# Patient Record
Sex: Female | Born: 1990 | Race: Black or African American | Hispanic: No | Marital: Single | Smoking: Never smoker
Health system: Southern US, Community
[De-identification: ages and names within clinical notes are randomized; demographics above are authoritative.]

## PROBLEM LIST (undated history)

## (undated) DIAGNOSIS — L700 Acne vulgaris: Secondary | ICD-10-CM

## (undated) DIAGNOSIS — A6000 Herpesviral infection of urogenital system, unspecified: Secondary | ICD-10-CM

## (undated) HISTORY — PX: KNEE ARTHROSCOPY WITH ANTERIOR CRUCIATE LIGAMENT (ACL) REPAIR: SHX5644

## (undated) HISTORY — DX: Acne vulgaris: L70.0

## (undated) HISTORY — PX: JOINT REPLACEMENT: SHX530

## (undated) HISTORY — PX: WISDOM TOOTH EXTRACTION: SHX21

---

## 2012-06-20 ENCOUNTER — Ambulatory Visit (INDEPENDENT_AMBULATORY_CARE_PROVIDER_SITE_OTHER): Payer: No Typology Code available for payment source | Admitting: Family Medicine

## 2012-06-20 VITALS — BP 98/54 | HR 81 | Temp 98.3°F | Resp 18 | Ht 65.0 in | Wt 138.8 lb

## 2012-06-20 DIAGNOSIS — Z9889 Other specified postprocedural states: Secondary | ICD-10-CM

## 2012-06-20 DIAGNOSIS — M25469 Effusion, unspecified knee: Secondary | ICD-10-CM

## 2012-06-20 DIAGNOSIS — M25461 Effusion, right knee: Secondary | ICD-10-CM

## 2012-06-20 DIAGNOSIS — M7050 Other bursitis of knee, unspecified knee: Secondary | ICD-10-CM | POA: Insufficient documentation

## 2012-06-20 DIAGNOSIS — IMO0002 Reserved for concepts with insufficient information to code with codable children: Secondary | ICD-10-CM

## 2012-06-20 MED ORDER — MELOXICAM 15 MG PO TABS: 15.0000 mg | ORAL_TABLET | Freq: Every day | ORAL | Status: DC

## 2012-06-20 NOTE — Assessment & Plan Note (Signed)
Patient has history of anterior cruciate ligament reconstruction 2 years ago. Patient has an acute swelling for the last 2 days. Patient's ligaments all appear to be intact but does have a 1+ knee effusion that is limiting range of motion. Patient declined aspiration today. Patient does have stability of the knee itself. At this time we'll try to treat conservatively with anti-inflammatories in a knee brace, hinged functional. Patient and will return in one week for further followup. If patient still has swelling at that time I would consider doing any aspiration and then retesting in need to make sure patient did not have any injury to the anterior cruciate ligament. Also would get x-rays at followup if still swollen. Likely patient is exacerbated with the new walking she is doing at school.

## 2012-06-20 NOTE — Patient Instructions (Signed)
Very nice to meet you. I'm giving you a medicine to take daily for the next 2 weeks. He can then take it daily as needed thereafter. I'm giving you a brace to give you some more support to your knee. In addition to that you can use the Ace wrap to try to help ring down the swelling. I'm giving you some exercises and stretches to do. Please did not wear heels until I see you again next week.  Pes Anserinus Syndrome with Rehab The pes anserine, also known as the goose's foot, is an area of the shinbone (tibia) near the knee joint where the tendons of three of the muscles of the thigh insert into the bone. These muscles are important for bending the knee and bringing the leg across the body. Just underneath the three tendons that attach at the pes anserinus exists a fluid filled sac (bursa) that is meant to reduce the friction between the tendons and the tibia. Pes anserinus syndrome is a condition that is characterized by inflammation of the bursa (bursitis) and/ or tendonitis (inflammation of the tendon) and may cause severe pain in the lower portion of the inner (medial) side of the knee. SYMPTOMS   Pain and inflammation over the lower portion of the medial side of the knee.   Pain that worsens as the duration of an activity increases.   Pain that worsens when bending the knee, especially against resistance.   A crackling sound (crepitation) when the tendon or bursa is moved or touched.  CAUSES  Bursitis and tendonitis are usually characterized as overuse injuries. Common mechanisms of injury include:  Stress placed on the knee from a sudden increase in the intensity, frequency, or duration of training.   Direct trauma to the upper leg (less common).  RISK INCREASES WITH:  Endurance sports (distance running or triathletes).   Making changes to or beginning a new training program.   Sports that place stress on the muscles that insert at the pes anserinus, such as those that require  pivoting, cutting, or jumping.   Improper training.   Poor strength and flexibility   Failure to warm-up properly before activity.   Improper knee alignment ( knock knees).   Arthritis of the knee.  PREVENTION  Warm up and stretch properly before activity.   Allow for adequate recovery between workouts.   Maintain physical fitness:   Strength, flexibility, and endurance.   Cardiovascular fitness.   Learn and use training methods that will reduce the stress placed on the pes anserinus.   Arch supports (orthotics) may be helpful for those with flat feet.  PROGNOSIS  If treated properly, then the symptoms of pes anserinus syndrome usually resolve within 6 weeks.  RELATED COMPLICATIONS   Persistent and potentially chronic pain if the condition is not treated properly.   Re-injury if activity is resumed before the injury is allowed to heal completely, or if one resumes improper training habits.  TREATMENT Treatment initially involves the use of ice and medication to help reduce pain and inflammation. The use of strengthening and stretching exercises may help reduce pain with activity. These exercises may be performed at home or with a therapist. Individuals who have flat feet may find benefit in wearing arch supports in their shoes. Some individuals find that compression bandages or knee sleeves help reduce symptoms. Your caregiver may recommend a corticosteroid injection to help reduce inflammation. If symptoms persist, despite conservative treatment for greater than 6 months, then surgery may be recommended.  MEDICATION   If pain medication is necessary, then nonsteroidal anti-inflammatory medications, such as aspirin and ibuprofen, or other minor pain relievers, such as acetaminophen, are often recommended.   Do not take pain medication for 7 days before surgery.   Prescription pain relievers may be given if deemed necessary by your caregiver. Use only as directed and only as  much as you need.   Corticosteroid injections may be given by your caregiver. These injections should be reserved for the most serious cases, because they may only be given a certain number of times.  SEEK MEDICAL CARE IF:  Treatment seems to offer no benefit, or the condition worsens.   Any medications produce adverse side effects.  EXERCISES  RANGE OF MOTION (ROM) AND STRETCHING EXERCISES - Pes Anserinus Syndrome These exercises may help you when beginning to rehabilitate your injury. Your symptoms may resolve with or without further involvement from your physician, physical therapist or athletic trainer. While completing these exercises, remember:   Restoring tissue flexibility helps normal motion to return to the joints. This allows healthier, less painful movement and activity.   An effective stretch should be held for at least 30 seconds.   A stretch should never be painful. You should only feel a gentle lengthening or release in the stretched tissue.  STRETCH - Hamstrings, Supine  Lie on your back. Loop a belt or towel over the ball of your right / left foot.   Straighten your right / left knee and slowly pull on the belt to raise your leg. Do not allow the right / left knee to bend. Keep your opposite leg flat on the floor.   Raise the leg until you feel a gentle stretch behind your right / left knee or thigh. Hold this position for __________ seconds.  Repeat __________ times. Complete this stretch __________ times per day.  STRETCH - Hamstrings, Doorway  Lie on your back with your right / left leg extended and resting on the wall and the opposite leg flat on the ground through the door. Initially, position your bottom farther away from the wall than the illustration shows.   Keep your right / left knee straight. If you feel a stretch behind your knee or thigh, hold this position for __________ seconds.   If you do not feel a stretch, scoot your bottom closer to the door, and  hold __________ seconds.  Repeat __________ times. Complete this stretch __________ times per day.  STRETCH - Hamstrings/Adductors, V-Sit  Sit on the floor with your legs extended in a large "V," keeping your knees straight.   With your head and chest upright, bend at your waist reaching for your right foot to stretch your left adductors.   You should feel a stretch in your left inner thigh. Hold for __________ seconds.   Return to the upright position to relax your leg muscles.   Continuing to keep your chest upright, bend straight forward at your waist to stretch your hamstrings.   You should feel a stretch behind both of your thighs and/or knees. Hold for __________ seconds.   Return to the upright position to relax your leg muscles.   Repeat steps 2 through 4.  Repeat __________ times. Complete this exercise __________ times per day.  STRETCH - Hamstrings, Standing  Stand or sit and extend your right / left leg, placing your foot on a chair or foot stool   Keeping a slight arch in your low back and your hips straight forward.  Lead with your chest and lean forward at the waist until you feel a gentle stretch in the back of your right / left knee or thigh. (When done correctly, this exercise requires leaning only a small distance.)   Hold this position for __________ seconds.  Repeat __________ times. Complete this stretch __________ times per day. STRETCH - Adductors, Lunge  While standing, spread your legs   Lean away from your right / left leg by bending your opposite knee. You may rest your hands on your thigh for balance.   You should feel a stretch in your right / left inner thigh. Hold for __________ seconds.  Repeat __________ times. Complete this exercise __________ times per day.  STRETCH - Adductors, Standing  Place your right / left foot on a counter or stable table. Turn away from your leg so both hips line up with your right / left leg.   Keeping your hips  facing forward, slowly bend your opposite leg until you feel a gentle stretch on the inside of your right / left thigh.   Hold for __________ seconds.  Repeat __________ times. Complete this exercise __________ times per day.  STRENGTHENING EXERCISES - Pes Anserinus Syndrome  These exercises may help you when beginning to rehabilitate your injury. They may resolve your symptoms with or without further involvement from your physician, physical therapist or athletic trainer. While completing these exercises, remember:   Muscles can gain both the endurance and the strength needed for everyday activities through controlled exercises.   Complete these exercises as instructed by your physician, physical therapist or athletic trainer. Progress the resistance and repetitions only as guided.  STRENGTH - Hamstring, Curls  Lay on your stomach with your legs extended. (If you lay on a bed, your feet may hang over the edge.)   Tighten the muscles in the back of your thigh to bend your right / left knee up to 90 degrees. Keep your hips flat on the bed/floor.   Hold this position for __________ seconds.   Slowly lower your leg back to the starting position.  Repeat __________ times. Complete this exercise __________ times per day.  OPTIONAL ANKLE WEIGHTS: Begin with ____________________, but DO NOT exceed ____________________. Increase in 1 lb/0.5 kg increments.  STRENGTH - Hip Adductors, Straight Leg Raises  Lie on your side so that your head, shoulders, knee and hip line up. You may place your upper foot in front to help maintain your balance. Your right / left leg should be on the bottom.   Roll your hips slightly forward, so that your hips are stacked directly over each other and your right / left knee is facing forward.   Tense the muscles in your inner thigh and lift your bottom leg 4-6 inches. Hold this position for __________ seconds.   Slowly lower your leg to the starting position. Allow the  muscles to fully relax before beginning the next repetition.  Repeat __________ times. Complete this exercise __________ times per day.  Document Released: 09/19/2005 Document Revised: 09/08/2011 Document Reviewed: 01/01/2009 [facility] Patient Information 2012 [place], [place].

## 2012-06-20 NOTE — Assessment & Plan Note (Signed)
Patient does give history of running recently as well without stretching afterwards as well as wearing the high heels which contributed to the knee effusion she thinks. Patient is very tender over the pes anserine. Patient is going to be placed on anti-inflammatories and given instructions on possible stretches to do. Patient will followup in one week's time to see if she improves. If we do not have much improvement will consider doing an injection in the area to calm down the bursitis. Also encourage patient to establish care here and gets records from [facility] where she had her knee surgery.

## 2012-06-20 NOTE — Progress Notes (Signed)
  Subjective:    Patient ID: Lisa Santana, female    DOB: 08-07-1991, 21 y.o.   MRN: [id number]  HPI 21 year old female with past medical history significant for an anterior cruciate ligament reconstruction of the right knee 2 years ago coming in with acute onset of right knee swelling. Patient states that she would send a Terrilee Croak was in high heels all nights Monday morning though she woke up and had significant swelling of the knee. Since that time the swelling of the knee has continued to be a problem giving her significant amount of pain and limiting her range of motion. Patient states she's never had this problem previously. Patient does not remember any injury or any twisting motion. Patient does state though that she has had this type of pain whenever she wears heels. Patient denies any radiation of pain denies any numbness denies any fevers or chills or redness of the skin surrounding the knee. Patient has not been wearing a brace for the knee. Patient also states that she did not finish her physical therapy for her anterior cruciate ligament due to moving appear for college.  Past medical history only significant for anterior cruciate ligament reconstruction Family history significant for hypertension as well as arthritis. Social history is significant for occasional drinking otherwise does not smoke and no illicits. Patient has no known drug allergies.  Review of Systems As stated above in history of present illness    Objective:   Physical Exam Vitals reviewed General: No apparent distress alert and oriented x3 mood and affect normal. Respiratory: Patient states in full sentences does not appear short of breath Knee exam: right Patient has significant 1+ effusion of the right knee that is ballotable, no erythema or effusion or obvious bony abnormalities. Palpation normal with no warmth or joint line tenderness or patellar tenderness or condyle tenderness. ROM is restricted in  flexion secondary to the effusion of the knee. Patient does not have crepitus on exam.  Ligaments with solid consistent endpoints including ACL, PCL, LCL, MCL, but hard to test thoroughly secondary to the effusion Negative Mcmurray's and provocative meniscal tests. Non painful patellar compression. Patient does have though significant pain of the pain is answering. Patient does have tight hamstrings as well. Patellar and quadriceps tendons unremarkable. Hamstring and quadriceps strength is normal.     Assessment & Plan:

## 2012-07-25 ENCOUNTER — Ambulatory Visit (INDEPENDENT_AMBULATORY_CARE_PROVIDER_SITE_OTHER): Payer: No Typology Code available for payment source | Admitting: Family Medicine

## 2012-07-25 VITALS — BP 107/72 | HR 66 | Temp 98.5°F | Resp 16 | Ht 65.0 in | Wt 144.0 lb

## 2012-07-25 DIAGNOSIS — A499 Bacterial infection, unspecified: Secondary | ICD-10-CM

## 2012-07-25 DIAGNOSIS — N760 Acute vaginitis: Secondary | ICD-10-CM

## 2012-07-25 LAB — POCT WET PREP WITH KOH
KOH Prep POC: NEGATIVE
RBC Wet Prep HPF POC: NEGATIVE
Trichomonas, UA: NEGATIVE
Yeast Wet Prep HPF POC: NEGATIVE

## 2012-07-25 MED ORDER — METRONIDAZOLE 500 MG PO TABS: 250.0000 mg | ORAL_TABLET | Freq: Three times a day (TID) | ORAL | Status: DC

## 2012-07-25 NOTE — Patient Instructions (Signed)
Bacterial Vaginosis Bacterial vaginosis (BV) is a vaginal infection where the normal balance of bacteria in the vagina is disrupted. The normal balance is then replaced by an overgrowth of certain bacteria. There are several different kinds of bacteria that can cause BV. BV is the most common vaginal infection in women of childbearing age. CAUSES   The cause of BV is not fully understood. BV develops when there is an increase or imbalance of harmful bacteria.  Some activities or behaviors can upset the normal balance of bacteria in the vagina and put women at increased risk including:  Having a new sex partner or multiple sex partners.  Douching.  Using an intrauterine device (IUD) for contraception.  It is not clear what role sexual activity plays in the development of BV. However, women that have never had sexual intercourse are rarely infected with BV. Women do not get BV from toilet seats, bedding, swimming pools or from touching objects around them.  SYMPTOMS   Grey vaginal discharge.  A fish-like odor with discharge, especially after sexual intercourse.  Itching or burning of the vagina and vulva.  Burning or pain with urination.  Some women have no signs or symptoms at all. DIAGNOSIS  Your caregiver must examine the vagina for signs of BV. Your caregiver will perform lab tests and look at the sample of vaginal fluid through a microscope. They will look for bacteria and abnormal cells (clue cells), a pH test higher than 4.5, and a positive amine test all associated with BV.  RISKS AND COMPLICATIONS   Pelvic inflammatory disease (PID).  Infections following gynecology surgery.  Developing HIV.  Developing herpes virus. TREATMENT  Sometimes BV will clear up without treatment. However, all women with symptoms of BV should be treated to avoid complications, especially if gynecology surgery is planned. Female partners generally do not need to be treated. However, BV may spread  between female sex partners so treatment is helpful in preventing a recurrence of BV.   BV may be treated with antibiotics. The antibiotics come in either pill or vaginal cream forms. Either can be used with nonpregnant or pregnant women, but the recommended dosages differ. These antibiotics are not harmful to the baby.  BV can recur after treatment. If this happens, a second round of antibiotics will often be prescribed.  Treatment is important for pregnant women. If not treated, BV can cause a premature delivery, especially for a pregnant woman who had a premature birth in the past. All pregnant women who have symptoms of BV should be checked and treated.  For chronic reoccurrence of BV, treatment with a type of prescribed gel vaginally twice a week is helpful. HOME CARE INSTRUCTIONS   Finish all medication as directed by your caregiver.  Do not have sex until treatment is completed.  Tell your sexual partner that you have a vaginal infection. They should see their caregiver and be treated if they have problems, such as a mild rash or itching.  Practice safe sex. Use condoms. Only have 1 sex partner. PREVENTION  Basic prevention steps can help reduce the risk of upsetting the natural balance of bacteria in the vagina and developing BV:  Do not have sexual intercourse (be abstinent).  Do not douche.  Use all of the medicine prescribed for treatment of BV, even if the signs and symptoms go away.  Tell your sex partner if you have BV. That way, they can be treated, if needed, to prevent reoccurrence. SEEK MEDICAL CARE IF:     Your symptoms are not improving after 3 days of treatment.  You have increased discharge, pain, or fever. MAKE SURE YOU:   Understand these instructions.  Will watch your condition.  Will get help right away if you are not doing well or get worse. FOR MORE INFORMATION  Division of STD Prevention (DSTDP), Centers for Disease Control and Prevention:  www.cdc.gov/std American Social Health Association (ASHA): www.ashastd.org  Document Released: 09/19/2005 Document Revised: 12/12/2011 Document Reviewed: 03/12/2009 [employer] Patient Information 2013 [employer].  

## 2012-07-25 NOTE — Progress Notes (Signed)
  Subjective:    Patient ID: Lisa Santana, female    DOB: 1991/06/08, 21 y.o.   MRN: [id number]  HPI SUBJECTIVE:  21 y.o. female complains of white, copious, malodorous and thick vaginal discharge for 1 week(s). Denies abnormal vaginal bleeding or significant pelvic pain or fever. No UTI symptoms. Denies history of known exposure to STD.  Patient's last menstrual period was 07/14/2012.  OBJECTIVE:  She appears well, afebrile. Abdomen: benign, soft, nontender, no masses. Pelvic Exam: normal external genitalia, vulva, vagina, cervix, uterus and adnexa, VULVA: normal appearing vulva with no masses, tenderness or lesions, but does have accumulation of whitish discharge. CERVIX: normal appearing cervix without discharge or lesions, UTERUS: uterus is normal size, shape, consistency and nontender, ADNEXA: normal adnexa in size, nontender and no masses, PAP: not indicated. Urine dipstick: not done. Wet prep does have a positive whiff test. Positive clue cells.  ASSESSMENT:  bacterial vaginosis  PLAN:  GC and chlamydia DNA  probe sent to lab. Treatment: Flagyl 500 BID x 7 days and abstain from coitus during course of treatment ROV prn if symptoms persist or worsen. Patient has had recurrent see of this she states for about 6 months. If this continues patient should likely have some lab work done in consider prophylactic treatment.    Review of Systems     Objective:   Physical Exam        Assessment & Plan:

## 2012-07-26 LAB — RPR

## 2012-07-26 LAB — HIV ANTIBODY (ROUTINE TESTING W REFLEX): HIV: NONREACTIVE

## 2012-07-27 ENCOUNTER — Encounter: Payer: Self-pay | Admitting: *Deleted

## 2012-07-27 LAB — GC/CHLAMYDIA PROBE AMP, GENITAL
Chlamydia, DNA Probe: NEGATIVE
GC Probe Amp, Genital: NEGATIVE

## 2012-11-14 ENCOUNTER — Ambulatory Visit: Payer: No Typology Code available for payment source

## 2012-11-14 ENCOUNTER — Ambulatory Visit (INDEPENDENT_AMBULATORY_CARE_PROVIDER_SITE_OTHER): Payer: No Typology Code available for payment source | Admitting: Emergency Medicine

## 2012-11-14 VITALS — BP 104/65 | HR 62 | Temp 98.0°F | Resp 16 | Ht 65.0 in | Wt 147.0 lb

## 2012-11-14 DIAGNOSIS — R05 Cough: Secondary | ICD-10-CM

## 2012-11-14 DIAGNOSIS — M25519 Pain in unspecified shoulder: Secondary | ICD-10-CM

## 2012-11-14 DIAGNOSIS — M549 Dorsalgia, unspecified: Secondary | ICD-10-CM

## 2012-11-14 MED ORDER — MELOXICAM 7.5 MG PO TABS: 7.5000 mg | ORAL_TABLET | Freq: Every day | ORAL | Status: DC

## 2012-11-14 MED ORDER — MELOXICAM 7.5 MG PO TABS: ORAL_TABLET | ORAL | Status: DC

## 2012-11-14 MED ORDER — BENZONATATE 100 MG PO CAPS: 100.0000 mg | ORAL_CAPSULE | Freq: Three times a day (TID) | ORAL | Status: DC | PRN

## 2012-11-14 MED ORDER — CYCLOBENZAPRINE HCL 10 MG PO TABS: 10.0000 mg | ORAL_TABLET | Freq: Three times a day (TID) | ORAL | Status: DC | PRN

## 2012-11-14 NOTE — Progress Notes (Signed)
  Subjective:    Patient ID: Audelia Acton, female    DOB: 10-16-1990, 22 y.o.   MRN: [id number]  HPI Dry cough since Sunday. Also experiencing back and shoulder pain, mainly on the left side. Patient has pain on her left side when she tries to take a deep breath. Pertinent history this patient had a termination done 3 weeks ago. Her cough is a dry cough not associated with any sputum. The pain occurs when moving or taking a deep breath but more with taking a deep breath. She denies any cramps in her legs she is not currently on control pills but has been in the past   Review of Systems     Objective:   Physical Exam patient is alert and cooperative and in no distress. Her neck is supple. Chest is clear to both auscultation and percussion. Cardiac reveals a regular rate and rhythm without murmurs. There is winging scapula on the left more than the right with tenderness along the medial scapular border.  [facility] reading (PRIMARY) by  Dr. Cleta Alberts there is no acute disease normal lung markings no pneumothorax the        Assessment & Plan:  History and physical are most consistent with musculoskeletal disease and  secondary diagnosis of cough. We'll treat with Mobic Tessalon Perles for cough Flexeril at night

## 2012-11-14 NOTE — Patient Instructions (Signed)
Take medications as instructed. Recheck in 2-3 days if not better

## 2013-07-18 ENCOUNTER — Ambulatory Visit (INDEPENDENT_AMBULATORY_CARE_PROVIDER_SITE_OTHER): Payer: No Typology Code available for payment source | Admitting: Family Medicine

## 2013-07-18 VITALS — BP 110/62 | HR 73 | Temp 98.3°F | Resp 16 | Ht 65.0 in | Wt 150.0 lb

## 2013-07-18 DIAGNOSIS — L708 Other acne: Secondary | ICD-10-CM

## 2013-07-18 DIAGNOSIS — Z3009 Encounter for other general counseling and advice on contraception: Secondary | ICD-10-CM

## 2013-07-18 DIAGNOSIS — Z23 Encounter for immunization: Secondary | ICD-10-CM

## 2013-07-18 DIAGNOSIS — Z5181 Encounter for therapeutic drug level monitoring: Secondary | ICD-10-CM

## 2013-07-18 DIAGNOSIS — M549 Dorsalgia, unspecified: Secondary | ICD-10-CM

## 2013-07-18 DIAGNOSIS — L709 Acne, unspecified: Secondary | ICD-10-CM

## 2013-07-18 LAB — POCT UA - MICROSCOPIC ONLY
Casts, Ur, LPF, POC: NEGATIVE
Crystals, Ur, HPF, POC: NEGATIVE
Yeast, UA: NEGATIVE

## 2013-07-18 LAB — POCT URINALYSIS DIPSTICK
Leukocytes, UA: NEGATIVE
Nitrite, UA: NEGATIVE
Protein, UA: NEGATIVE
Urobilinogen, UA: 0.2
pH, UA: 7

## 2013-07-18 LAB — HEPATIC FUNCTION PANEL
ALT: 9 U/L (ref 0–35)
Albumin: 4.3 g/dL (ref 3.5–5.2)
Bilirubin, Direct: 0.1 mg/dL (ref 0.0–0.3)
Total Bilirubin: 0.4 mg/dL (ref 0.3–1.2)

## 2013-07-18 MED ORDER — CYCLOBENZAPRINE HCL 5 MG PO TABS
10.0000 mg | ORAL_TABLET | Freq: Three times a day (TID) | ORAL | Status: DC | PRN
Start: 2013-07-18 — End: 2015-09-15

## 2013-07-18 MED ORDER — MELOXICAM 15 MG PO TABS: 15.0000 mg | ORAL_TABLET | Freq: Every day | ORAL | Status: DC

## 2013-07-18 NOTE — Patient Instructions (Signed)
Back Pain, Adult Low back pain is very common. About 1 in 5 people have back pain.The cause of low back pain is rarely dangerous. The pain often gets better over time.About half of people with a sudden onset of back pain feel better in just 2 weeks. About 8 in 10 people feel better by 6 weeks.  CAUSES Some common causes of back pain include:  Strain of the muscles or ligaments supporting the spine.  Wear and tear (degeneration) of the spinal discs.  Arthritis.  Direct injury to the back. DIAGNOSIS Most of the time, the direct cause of low back pain is not known.However, back pain can be treated effectively even when the exact cause of the pain is unknown.Answering your caregiver's questions about your overall health and symptoms is one of the most accurate ways to make sure the cause of your pain is not dangerous. If your caregiver needs more information, he or she may order lab work or imaging tests (X-rays or MRIs).However, even if imaging tests show changes in your back, this usually does not require surgery. HOME CARE INSTRUCTIONS For many people, back pain returns.Since low back pain is rarely dangerous, it is often a condition that people can learn to manageon their own.   Remain active. It is stressful on the back to sit or stand in one place. Do not sit, drive, or stand in one place for more than 30 minutes at a time. Take short walks on level surfaces as soon as pain allows.Try to increase the length of time you walk each day.  Do not stay in bed.Resting more than 1 or 2 days can delay your recovery.  Do not avoid exercise or work.Your body is made to move.It is not dangerous to be active, even though your back may hurt.Your back will likely heal faster if you return to being active before your pain is gone.  Pay attention to your body when you bend and lift. Many people have less discomfortwhen lifting if they bend their knees, keep the load close to their bodies,and  avoid twisting. Often, the most comfortable positions are those that put less stress on your recovering back.  Find a comfortable position to sleep. Use a firm mattress and lie on your side with your knees slightly bent. If you lie on your back, put a pillow under your knees.  Only take over-the-counter or prescription medicines as directed by your caregiver. Over-the-counter medicines to reduce pain and inflammation are often the most helpful.Your caregiver may prescribe muscle relaxant drugs.These medicines help dull your pain so you can more quickly return to your normal activities and healthy exercise.  Put ice on the injured area.  Put ice in a plastic bag.  Place a towel between your skin and the bag.  Leave the ice on for 15-20 minutes, 3-4 times a day for the first 2 to 3 days. After that, ice and heat may be alternated to reduce pain and spasms.  Ask your caregiver about trying back exercises and gentle massage. This may be of some benefit.  Avoid feeling anxious or stressed.Stress increases muscle tension and can worsen back pain.It is important to recognize when you are anxious or stressed and learn ways to manage it.Exercise is a great option. SEEK MEDICAL CARE IF:  You have pain that is not relieved with rest or medicine.  You have pain that does not improve in 1 week.  You have new symptoms.  You are generally not feeling well. SEEK   IMMEDIATE MEDICAL CARE IF:   You have pain that radiates from your back into your legs.  You develop new bowel or bladder control problems.  You have unusual weakness or numbness in your arms or legs.  You develop nausea or vomiting.  You develop abdominal pain.  You feel faint. Document Released: 09/19/2005 Document Revised: 03/20/2012 Document Reviewed: 02/07/2011 [facility] Patient Information 2014 [place], [place]. Contraception Choices Contraception (birth control) is the use of any methods or devices to prevent pregnancy.  Below are some methods to help avoid pregnancy. HORMONAL METHODS   Contraceptive implant. This is a thin, plastic tube containing progesterone hormone. It does not contain estrogen hormone. Your caregiver inserts the tube in the inner part of the upper arm. The tube can remain in place for up to 3 years. After 3 years, the implant must be removed. The implant prevents the ovaries from releasing an egg (ovulation), thickens the cervical mucus which prevents sperm from entering the uterus, and thins the lining of the inside of the uterus.  Progesterone-only injections. These injections are given every 3 months by your caregiver to prevent pregnancy. This synthetic progesterone hormone stops the ovaries from releasing eggs. It also thickens cervical mucus and changes the uterine lining. This makes it harder for sperm to survive in the uterus.  Birth control pills. These pills contain estrogen and progesterone hormone. They work by stopping the egg from forming in the ovary (ovulation). Birth control pills are prescribed by a caregiver.Birth control pills can also be used to treat heavy periods.  Minipill. This type of birth control pill contains only the progesterone hormone. They are taken every day of each month and must be prescribed by your caregiver.  Birth control patch. The patch contains hormones similar to those in birth control pills. It must be changed once a week and is prescribed by a caregiver.  Vaginal ring. The ring contains hormones similar to those in birth control pills. It is left in the vagina for 3 weeks, removed for 1 week, and then a new one is put back in place. The patient must be comfortable inserting and removing the ring from the vagina.A caregiver's prescription is necessary.  Emergency contraception. Emergency contraceptives prevent pregnancy after unprotected sexual intercourse. This pill can be taken right after sex or up to 5 days after unprotected sex. It is most  effective the sooner you take the pills after having sexual intercourse. Emergency contraceptive pills are available without a prescription. Check with your pharmacist. Do not use emergency contraception as your only form of birth control. BARRIER METHODS   Female condom. This is a thin sheath (latex or rubber) that is worn over the penis during sexual intercourse. It can be used with spermicide to increase effectiveness.  Female condom. This is a soft, loose-fitting sheath that is put into the vagina before sexual intercourse.  Diaphragm. This is a soft, latex, dome-shaped barrier that must be fitted by a caregiver. It is inserted into the vagina, along with a spermicidal jelly. It is inserted before intercourse. The diaphragm should be left in the vagina for 6 to 8 hours after intercourse.  Cervical cap. This is a round, soft, latex or plastic cup that fits over the cervix and must be fitted by a caregiver. The cap can be left in place for up to 48 hours after intercourse.  Sponge. This is a soft, circular piece of polyurethane foam. The sponge has spermicide in it. It is inserted into the vagina after  wetting it and before sexual intercourse.  Spermicides. These are chemicals that kill or block sperm from entering the cervix and uterus. They come in the form of creams, jellies, suppositories, foam, or tablets. They do not require a prescription. They are inserted into the vagina with an applicator before having sexual intercourse. The process must be repeated every time you have sexual intercourse. INTRAUTERINE CONTRACEPTION  Intrauterine device (IUD). This is a T-shaped device that is put in a woman's uterus during a menstrual period to prevent pregnancy. There are 2 types:  Copper IUD. This type of IUD is wrapped in copper wire and is placed inside the uterus. Copper makes the uterus and fallopian tubes produce a fluid that kills sperm. It can stay in place for 10 years.  Hormone IUD. This  type of IUD contains the hormone progestin (synthetic progesterone). The hormone thickens the cervical mucus and prevents sperm from entering the uterus, and it also thins the uterine lining to prevent implantation of a fertilized egg. The hormone can weaken or kill the sperm that get into the uterus. It can stay in place for 5 years. PERMANENT METHODS OF CONTRACEPTION  Female tubal ligation. This is when the woman's fallopian tubes are surgically sealed, tied, or blocked to prevent the egg from traveling to the uterus.  Female sterilization. This is when the female has the tubes that carry sperm tied off (vasectomy).This blocks sperm from entering the vagina during sexual intercourse. After the procedure, the man can still ejaculate fluid (semen). NATURAL PLANNING METHODS  Natural family planning. This is not having sexual intercourse or using a barrier method (condom, diaphragm, cervical cap) on days the woman could become pregnant.  Calendar method. This is keeping track of the length of each menstrual cycle and identifying when you are fertile.  Ovulation method. This is avoiding sexual intercourse during ovulation.  Symptothermal method. This is avoiding sexual intercourse during ovulation, using a thermometer and ovulation symptoms.  Post-ovulation method. This is timing sexual intercourse after you have ovulated. Regardless of which type or method of contraception you choose, it is important that you use condoms to protect against the transmission of sexually transmitted diseases (STDs). Talk with your caregiver about which form of contraception is most appropriate for you. Document Released: 09/19/2005 Document Revised: 12/12/2011 Document Reviewed: 01/26/2011 [facility] Patient Information 2014 [place], [place].

## 2013-07-18 NOTE — Progress Notes (Signed)
[address]   [place], [place]  [address]   [phone]  Subjective:    Patient ID: Lisa Santana, female    DOB: 07-18-1991, 22 y.o.   MRN: [id number]  HPI This 22 y.o. female presents for evaluation of the following:  Back pain for 2 days. Not sure what precipitated pain. Expecting menses this weekend.  Took 2 aleve yesterday with some relief. No heat or ice. Pain worse as day goes on. No pain in morning, no pain during sleep. No abdominal pain, no neck pain, no numbness or tingling in arms or legs, no weakness, no falls. No injury or fall. Working out at gym- squats, ab work, run on treadmill.  Has 2 day history of having BM right after eating. No diarrhea. 1-2 BMs per day in the last couple of days. No bloody stool.   Was on OCPs, took for 3-4 months. Not sure of brand. Stopped taking due to nausea; was taking them in the morning. Having unprotected intercourse and was concerned that she is pregnant. Taking Accutane from Dr. [facility]).  Has not had LFTs checked according to patient.    Review of Systems  Constitutional: Positive for appetite change. Negative for fever and activity change.  Respiratory: Negative for cough and shortness of breath.   Cardiovascular: Negative for chest pain.  Gastrointestinal: Negative for nausea, abdominal pain, diarrhea, constipation and blood in stool.  Musculoskeletal: Positive for back pain. Negative for arthralgias, gait problem, myalgias, neck pain and neck stiffness.  Neurological: Positive for headaches. Negative for weakness and numbness.  Psychiatric/Behavioral: Negative for sleep disturbance.   Occasional headache. Nothing unusual for her.   Decreased appetite for 2 days.  Past Medical History  Diagnosis Date  . Acne vulgaris     Accutane in 2014.   Past Surgical History  Procedure Laterality Date  . Joint replacement     No Known Allergies Current Outpatient Prescriptions on File Prior to Visit  Medication  Sig Dispense Refill  . benzonatate (TESSALON) 100 MG capsule Take 1-2 capsules (100-200 mg total) by mouth 3 (three) times daily as needed for cough.  40 capsule  0  . cyclobenzaprine (FLEXERIL) 10 MG tablet Take 1 tablet (10 mg total) by mouth 3 (three) times daily as needed for muscle spasms.  30 tablet  0  . meloxicam (MOBIC) 7.5 MG tablet Take one to 2 tablets daily as needed for back pain  30 tablet  0  . metroNIDAZOLE (FLAGYL) 500 MG tablet Take 0.5 tablets (250 mg total) by mouth 3 (three) times daily.  21 tablet  0   No current facility-administered medications on file prior to visit.    Objective:   Physical Exam  Constitutional: She is oriented to person, place, and time. She appears well-developed and well-nourished. No distress.  Eyes: Conjunctivae are normal.  Neck: Normal range of motion. Neck supple. No thyromegaly present.  Cardiovascular: Normal rate, regular rhythm, normal heart sounds and intact distal pulses.   Pulmonary/Chest: Effort normal and breath sounds normal.  Abdominal: Soft. Bowel sounds are normal. She exhibits no distension and no mass. There is no tenderness. There is no rebound and no guarding.  Musculoskeletal: Normal range of motion. She exhibits tenderness. She exhibits no edema.       Right shoulder: Normal.       Left shoulder: Normal.       Cervical back: Normal.       Thoracic back: She exhibits tenderness and pain. She exhibits  normal range of motion, no bony tenderness and no spasm.       Lumbar back: Normal.       Back:  Mid thoracic tenderness R paraspinal region.    Lymphadenopathy:    She has no cervical adenopathy.  Neurological: She is alert and oriented to person, place, and time. She has normal reflexes. No cranial nerve deficit. Coordination normal.  Skin: Skin is warm and dry. She is not diaphoretic.  Acne noted on face.  Psychiatric: She has a normal mood and affect. Her behavior is normal. Judgment and thought content normal.    Results for orders placed in visit on 07/18/13  POCT URINALYSIS DIPSTICK      Result Value Range   Color, UA yellow     Clarity, UA clear     Glucose, UA neg     Bilirubin, UA neg     Ketones, UA neg     Spec Grav, UA 1.015     Blood, UA neg     pH, UA 7.0     Protein, UA neg     Urobilinogen, UA 0.2     Nitrite, UA neg     Leukocytes, UA Negative    POCT UA - MICROSCOPIC ONLY      Result Value Range   WBC, Ur, HPF, POC 2-3     RBC, urine, microscopic 0-1     Bacteria, U Microscopic 1+     Mucus, UA pos     Epithelial cells, urine per micros 5-6     Crystals, Ur, HPF, POC neg     Casts, Ur, LPF, POC neg     Yeast, UA neg    POCT URINE PREGNANCY      Result Value Range   Preg Test, Ur Negative         Assessment & Plan:  Back pain - Plan: POCT urinalysis dipstick, POCT UA - Microscopic Only, POCT urine pregnancy, Hepatic function panel  Acne - Plan: Hepatic function panel  Encounter for therapeutic drug monitoring - Plan: Hepatic function panel  General counselling and advice on contraception - Plan: Hepatic function panel  Need for prophylactic vaccination and inoculation against influenza - Plan: Flu Vaccine QUAD 36+ mos IM  Meds ordered this encounter  Medications  . cyclobenzaprine (FLEXERIL) 5 MG tablet    Sig: Take 2 tablets (10 mg total) by mouth 3 (three) times daily as needed for muscle spasms.    Dispense:  30 tablet    Refill:  0  . meloxicam (MOBIC) 15 MG tablet    Sig: Take 1 tablet (15 mg total) by mouth daily.    Dispense:  30 tablet    Refill:  1   1. Thoracic back pain/strain:  Recurrent; rx for Meloxicam and Flexeril provided; recommend heat to area; avoid heavy lifting > 10 pounds for the next two weeks; recommend passive ROM daily.   2.  Acne: stable/improving with Accutane; obtain LFTs. 3.  Monitoring drug therapy:  Stable; obtain labs. 4.  Contraception Counseling:  Discussed at length.  Discussed importance of contraception while  taking Accutane. Discussed options with patient who will discuss with her mother and make a decision. Encouraged abstinence or condom use until she decides on something more long term.

## 2014-08-22 ENCOUNTER — Ambulatory Visit (INDEPENDENT_AMBULATORY_CARE_PROVIDER_SITE_OTHER): Payer: PRIVATE HEALTH INSURANCE | Admitting: Family Medicine

## 2014-08-22 VITALS — BP 112/68 | HR 73 | Temp 98.3°F | Resp 16 | Ht 65.0 in | Wt 149.6 lb

## 2014-08-22 DIAGNOSIS — N946 Dysmenorrhea, unspecified: Secondary | ICD-10-CM | POA: Diagnosis not present

## 2014-08-22 DIAGNOSIS — Z202 Contact with and (suspected) exposure to infections with a predominantly sexual mode of transmission: Secondary | ICD-10-CM

## 2014-08-22 DIAGNOSIS — R102 Pelvic and perineal pain: Secondary | ICD-10-CM

## 2014-08-22 LAB — POCT WET PREP WITH KOH
Clue Cells Wet Prep HPF POC: NEGATIVE
KOH Prep POC: NEGATIVE
TRICHOMONAS UA: NEGATIVE
WBC Wet Prep HPF POC: NEGATIVE
YEAST WET PREP PER HPF POC: NEGATIVE

## 2014-08-22 LAB — POCT URINE PREGNANCY: Preg Test, Ur: NEGATIVE

## 2014-08-22 LAB — POCT URINALYSIS DIPSTICK
Bilirubin, UA: NEGATIVE
Glucose, UA: NEGATIVE
KETONES UA: NEGATIVE
Leukocytes, UA: NEGATIVE
Nitrite, UA: NEGATIVE
Protein, UA: NEGATIVE
SPEC GRAV UA: 1.02
Urobilinogen, UA: 0.2
pH, UA: 6

## 2014-08-22 LAB — POCT UA - MICROSCOPIC ONLY
BACTERIA, U MICROSCOPIC: NEGATIVE
CASTS, UR, LPF, POC: NEGATIVE
CRYSTALS, UR, HPF, POC: NEGATIVE
Mucus, UA: NEGATIVE
YEAST UA: NEGATIVE

## 2014-08-22 NOTE — Patient Instructions (Signed)
Treat this as a very heavy menstrual cycle.  Take ibuprofen 800 mg (4200 mg) 3 times daily for pain and cramping  If the bleeding continues to persist over the next several days, please return Sunday or Monday if not improving. If worse at anytime return sooner or go to the emergency room if necessary.  If symptoms continue to persist we may need additional lab work and/or an ultrasound.  Advise regular use of condoms when you or sexually involved. Consider discussing longer term birth control measures with a physician soon.

## 2014-08-22 NOTE — Progress Notes (Signed)
Subjective: Patient is here complaining of heavy bleeding the last couple of days and abdominal pain in the low abdomen. No nausea or vomiting. She has regular menses, not on contraception. She took the morning after pill late last month. She has no dysuria or or problems with her bowels. Menses are usually regular. This started with atelectatic is going to come on for couple days and then coming real heavy when it did come.  Objective: Healthy-appearing young lady in no acute distress. No CVA tenderness. Abdomen soft without organomegaly mass or tenderness. Normal external genitalia. Vaginal mucosa unremarkable except for blood which was swabbed out. A little blood is coming from the os, and a fairly normal fashion, with no clots or masses. No cervicitis. Bimanual exam feels no adnexal or uterine masses. The uterus is a little deep in the pelvis but did not feel enlarged.     Results for orders placed or performed in visit on 08/22/14  POCT UA - Microscopic Only  Result Value Ref Range   WBC, Ur, HPF, POC 0-1    RBC, urine, microscopic 2-4    Bacteria, U Microscopic neg    Mucus, UA neg    Epithelial cells, urine per micros 0-3    Crystals, Ur, HPF, POC neg    Casts, Ur, LPF, POC neg    Yeast, UA neg   POCT urine pregnancy  Result Value Ref Range   Preg Test, Ur Negative   POCT urinalysis dipstick  Result Value Ref Range   Color, UA yellow    Clarity, UA cloudy    Glucose, UA neg    Bilirubin, UA neg    Ketones, UA neg    Spec Grav, UA 1.020    Blood, UA moderate    pH, UA 6.0    Protein, UA neg    Urobilinogen, UA 0.2    Nitrite, UA neg    Leukocytes, UA Negative   POCT Wet Prep with KOH  Result Value Ref Range   Trichomonas, UA Negative    Clue Cells Wet Prep HPF POC neg    Epithelial Wet Prep HPF POC 2-4    Yeast Wet Prep HPF POC neg    Bacteria Wet Prep HPF POC trace    RBC Wet Prep HPF POC tntc    WBC Wet Prep HPF POC neg    KOH Prep POC Negative     Assessment: Heavy menstrual cycle Dysmenorrhea STD wrist  Plan  Gen-Probe was taken.

## 2014-08-23 LAB — GC/CHLAMYDIA PROBE AMP
CT Probe RNA: NEGATIVE
GC Probe RNA: NEGATIVE

## 2014-11-02 ENCOUNTER — Encounter (HOSPITAL_COMMUNITY): Payer: Self-pay | Admitting: Emergency Medicine

## 2014-11-02 ENCOUNTER — Emergency Department (HOSPITAL_COMMUNITY)
Admission: EM | Admit: 2014-11-02 | Discharge: 2014-11-02 | Disposition: A | Discharge status: Home / Self Care | Payer: No Typology Code available for payment source | Attending: Emergency Medicine | Admitting: Emergency Medicine

## 2014-11-02 DIAGNOSIS — S30861A Insect bite (nonvenomous) of abdominal wall, initial encounter: Secondary | ICD-10-CM | POA: Diagnosis not present

## 2014-11-02 DIAGNOSIS — W57XXXA Bitten or stung by nonvenomous insect and other nonvenomous arthropods, initial encounter: Secondary | ICD-10-CM | POA: Insufficient documentation

## 2014-11-02 DIAGNOSIS — Z872 Personal history of diseases of the skin and subcutaneous tissue: Secondary | ICD-10-CM | POA: Insufficient documentation

## 2014-11-02 DIAGNOSIS — S30860A Insect bite (nonvenomous) of lower back and pelvis, initial encounter: Secondary | ICD-10-CM | POA: Diagnosis not present

## 2014-11-02 DIAGNOSIS — S70369A Insect bite (nonvenomous), unspecified thigh, initial encounter: Secondary | ICD-10-CM | POA: Insufficient documentation

## 2014-11-02 DIAGNOSIS — Y998 Other external cause status: Secondary | ICD-10-CM | POA: Insufficient documentation

## 2014-11-02 DIAGNOSIS — Y9389 Activity, other specified: Secondary | ICD-10-CM | POA: Diagnosis not present

## 2014-11-02 DIAGNOSIS — Z791 Long term (current) use of non-steroidal anti-inflammatories (NSAID): Secondary | ICD-10-CM | POA: Insufficient documentation

## 2014-11-02 DIAGNOSIS — S80861A Insect bite (nonvenomous), right lower leg, initial encounter: Secondary | ICD-10-CM | POA: Diagnosis not present

## 2014-11-02 DIAGNOSIS — S40862A Insect bite (nonvenomous) of left upper arm, initial encounter: Secondary | ICD-10-CM | POA: Insufficient documentation

## 2014-11-02 DIAGNOSIS — S40861A Insect bite (nonvenomous) of right upper arm, initial encounter: Secondary | ICD-10-CM | POA: Diagnosis not present

## 2014-11-02 DIAGNOSIS — Z792 Long term (current) use of antibiotics: Secondary | ICD-10-CM | POA: Diagnosis not present

## 2014-11-02 DIAGNOSIS — R21 Rash and other nonspecific skin eruption: Secondary | ICD-10-CM | POA: Diagnosis present

## 2014-11-02 DIAGNOSIS — Y9289 Other specified places as the place of occurrence of the external cause: Secondary | ICD-10-CM | POA: Diagnosis not present

## 2014-11-02 MED ORDER — PERMETHRIN 5 % EX CREA: TOPICAL_CREAM | CUTANEOUS | Status: DC

## 2014-11-02 MED ORDER — DEXAMETHASONE SODIUM PHOSPHATE 10 MG/ML IJ SOLN
10.0000 mg | Freq: Once | INTRAMUSCULAR | Status: AC
  Administered 2014-11-02: 10 mg via INTRAMUSCULAR
  Filled 2014-11-02: qty 1

## 2014-11-02 MED ORDER — HYDROXYZINE HCL 25 MG PO TABS: 25.0000 mg | ORAL_TABLET | Freq: Four times a day (QID) | ORAL | Status: DC

## 2014-11-02 NOTE — ED Notes (Addendum)
C/o itching and rash/ "bumps all over" since this morning.

## 2014-11-02 NOTE — ED Provider Notes (Signed)
CSN: [id number]     Arrival date & time 11/02/14  2211 History  This chart was scribed for non-physician practitioner, Jaynie Crumbleatyana Zhara Gieske, PA-C working with Dione Boozeavid Glick, MD by Greggory StallionKayla Andersen, ED scribe. This patient was seen in room TR11C/TR11C and the patient's care was started at 11:00 PM.    Chief Complaint  Patient presents with  . Rash   The history is provided by the patient. No language interpreter was used.    HPI Comments: Lisa Santana is a 24 y.o. female who presents to the Emergency Department complaining of a diffuse, itchy rash that started this morning. Denies new soaps, lotions, detergents, foods, products, pets but states she slept on someone's couch last night. Pt has not yet done anything for her symptoms. Rash is over back, arms, legs, buttock. No hx of the same.   Past Medical History  Diagnosis Date  . Acne vulgaris     Accutane in 2014.   Past Surgical History  Procedure Laterality Date  . Joint replacement     Family History  Problem Relation Age of Onset  . Hypertension Mother    History  Substance Use Topics  . Smoking status: Never Smoker   . Smokeless tobacco: Never Used  . Alcohol Use: No   OB History    No data available     Review of Systems  Skin: Positive for rash.  All other systems reviewed and are negative.  Allergies  Review of patient's allergies indicates no known allergies.  Home Medications   Prior to Admission medications   Medication Sig Start Date End Date Taking? Authorizing Provider  benzonatate (TESSALON) 100 MG capsule Take 1-2 capsules (100-200 mg total) by mouth 3 (three) times daily as needed for cough. 11/14/12   Collene GobbleSteven A Daub, MD  cyclobenzaprine (FLEXERIL) 10 MG tablet Take 1 tablet (10 mg total) by mouth 3 (three) times daily as needed for muscle spasms. 11/14/12   Collene GobbleSteven A Daub, MD  cyclobenzaprine (FLEXERIL) 5 MG tablet Take 2 tablets (10 mg total) by mouth 3 (three) times daily as needed for muscle spasms.  07/18/13   Ethelda ChickKristi M Smith, MD  meloxicam (MOBIC) 15 MG tablet Take 1 tablet (15 mg total) by mouth daily. 07/18/13   Ethelda ChickKristi M Smith, MD  meloxicam (MOBIC) 7.5 MG tablet Take one to 2 tablets daily as needed for back pain 11/14/12   Collene GobbleSteven A Daub, MD  metroNIDAZOLE (FLAGYL) 500 MG tablet Take 0.5 tablets (250 mg total) by mouth 3 (three) times daily. 07/25/12   Judi SaaZachary M Smith, DO   BP 118/60 mmHg  Pulse 69  Temp(Src) 97.2 F (36.2 C) (Oral)  Resp 18  Ht 5\' 4"  (1.626 m)  Wt 155 lb (70.308 kg)  BMI 26.59 kg/m2  SpO2 100%  LMP 10/15/2014   Physical Exam  Constitutional: She is oriented to person, place, and time. She appears well-developed and well-nourished. No distress.  HENT:  Head: Normocephalic and atraumatic.  No rash over lips or oral mucosa  Eyes: Conjunctivae and EOM are normal.  Neck: Neck supple. No tracheal deviation present.  Cardiovascular: Normal rate.   Pulmonary/Chest: Effort normal. No respiratory distress.  Musculoskeletal: Normal range of motion.  Neurological: She is alert and oriented to person, place, and time.  Skin: Skin is warm and dry.  Erythematous papules, appear like bug bites, improved in linear patterns over bilateral arms, flank, lower back, thighs, lower legs. No vesicles, no drainage, no surrounding cellulitis.  Psychiatric: She has a normal  mood and affect. Her behavior is normal.  Nursing note and vitals reviewed.   ED Course  Procedures (including critical care time)  DIAGNOSTIC STUDIES: Oxygen Saturation is 100% on RA, normal by my interpretation.    COORDINATION OF CARE: 11:03 PM-Discussed treatment plan which includes decadron, benadryl, and permethrin cream with pt at bedside and pt agreed to plan.   Labs Review Labs Reviewed - No data to display  Imaging Review No results found.   EKG Interpretation None      MDM   Final diagnoses:  Rash  Insect bite    Patient with a rash that appears to look like bug bites over her  body after sleeping on someone else's couch last night. No new products, new foods, no contact with pets. Rash is most consistent with possible bedbug bites. Patient is very itchy, will give a shot of steroids to help with that. Home with hydroxyzine, topical hydrocortisone cream. Follow-up as needed. Have instructed her to wash her clothing in hot water. She is also concerned about possible scabies, I have given her prescription for permethrin, but told her not to take it on this rash is not improving. Pt has no oral mucosal involvement, no respiratory complaints.   Filed Vitals:   11/02/14 2214  BP: 118/60  Pulse: 69  Temp: 97.2 F (36.2 C)  TempSrc: Oral  Resp: 18  Height:  (1.626 m)  Weight: 155 lb (70.308 kg)  SpO2: 100%       I personally performed the services described in this documentation, which was scribed in my presence. The recorded information has been reviewed and is accurate.  Lottie Mussel, PA-C 11/02/14 2315  Dione Booze, MD 11/03/14 [phone]

## 2014-11-02 NOTE — Discharge Instructions (Signed)
Wash all your clothes in hot water with bleach. Hydroxyzine for itching. Hydrocortisone cream topically. Avoid scratching. Do not use permethrin unless rash is not improving, i do not think you have scabies. Follow up as needed.     Bedbugs Bedbugs are tiny bugs that live in and around beds. During the day, they hide in mattresses and other places near beds. They come out at night and bite people lying in bed. They need blood to live and grow. Bedbugs can be found in beds anywhere. Usually, they are found in places where many people come and go (hotels, shelters, hospitals). It does not matter whether the place is dirty or clean. Getting bitten by bedbugs rarely causes a medical problem. The biggest problem can be getting rid of them. This often takes the work of a Oncologistpest control expert. CAUSES  Less use of pesticides. Bedbugs were common before the 1950s. Then, strong pesticides such as DDT nearly wiped them out. Today, these pesticides are not used because they harm the environment and can cause health problems.  More travel. Besides mattresses, bedbugs can also live in clothing and luggage. They can come along as people travel from place to place. Bedbugs are more common in certain parts of the world. When people travel to those areas, the bugs can come home with them.  Presence of birds and bats. Bedbugs often infest birds and bats. If you have these animals in or near your home, bedbugs may infest your house, too. SYMPTOMS It does not hurt to be bitten by a bedbug. You will probably not wake up when you are bitten. Bedbugs usually bite areas of the skin that are not covered. Symptoms may show when you wake up, or they may take a day or more to show up. Symptoms may include:  Small red bumps on the skin. These might be lined up in a row or clustered in a group.  A darker red dot in the middle of red bumps.  Blisters on the skin. There may be swelling and very bad itching. These may be signs  of an allergic reaction. This does not happen often. DIAGNOSIS Bedbug bites might look and feel like other types of insect bites. The bugs do not stay on the body like ticks or lice. They bite, drop off, and crawl away to hide. Your caregiver will probably:  Ask about your symptoms.  Ask about your recent activities and travel.  Check your skin for bedbug bites.  Ask you to check at home for signs of bedbugs. You should look for:  Spots or stains on the bed or nearby. This could be from bedbugs that were crushed or from their eggs or waste.  Bedbugs themselves. They are reddish-brown, oval, and flat. They do not fly. They are about the size of an apple seed.  Places to look for bedbugs include:  Beds. Check mattresses, headboards, box springs, and bed frames.  On drapes and curtains near the bed.  Under carpeting in the bedroom.  Behind electrical outlets.  Behind any wallpaper that is peeling.  Inside luggage. TREATMENT Most bedbug bites do not need treatment. They usually go away on their own in a few days. The bites are not dangerous. However, treatment may be needed if you have scratched so much that your skin has become infected. You may also need treatment if you are allergic to bedbug bites. Treatment options include:  A drug that stops swelling and itching (corticosteroid). Usually, a cream is rubbed  on the skin. If you have a bad rash, you may be given a corticosteroid pill.  Oral antihistamines. These are pills to help control itching.  Antibiotic medicines. An antibiotic may be prescribed for infected skin. HOME CARE INSTRUCTIONS   Take any medicine prescribed by your caregiver for your bites. Follow the directions carefully.  Consider wearing pajamas with long sleeves and pant legs.  Your bedroom may need to be treated. A pest control expert should make sure the bedbugs are gone. You may need to throw away mattresses or luggage. Ask the pest control expert what  you can do to keep the bedbugs from coming back. Common suggestions include:  Putting a plastic cover over your mattress.  Washing and drying your clothes and bedding in hot water and a hot dryer. The temperature should be hotter than 120 F (48.9 C). Bedbugs are killed by high temperatures.  Vacuuming carefully all around your bed. Vacuum in all cracks and crevices where the bugs might hide. Do this often.  Carefully checking all used furniture, bedding, or clothes that you bring into your house.  Eliminating bird nests and bat roosts.  If you get bedbug bites when traveling, check all your possessions carefully before bringing them into your house. If you find any bugs on clothes or in your luggage, consider throwing those items away. SEEK MEDICAL CARE IF:  You have red bug bites that keep coming back.  You have red bug bites that itch badly.  You have bug bites that cause a skin rash.  You have scratch marks that are red and sore. SEEK IMMEDIATE MEDICAL CARE IF: You have a fever. Document Released: 10/22/2010 Document Revised: 12/12/2011 Document Reviewed: 10/22/2010 [facility] Patient Information 2015 [place], [place]. This information is not intended to replace advice given to you by your health care provider. Make sure you discuss any questions you have with your health care provider.

## 2015-09-14 ENCOUNTER — Encounter (HOSPITAL_COMMUNITY): Payer: Self-pay | Admitting: *Deleted

## 2015-09-14 ENCOUNTER — Emergency Department (HOSPITAL_COMMUNITY)
Admission: EM | Admit: 2015-09-14 | Discharge: 2015-09-15 | Disposition: A | Discharge status: Home / Self Care | Payer: No Typology Code available for payment source | Attending: Emergency Medicine | Admitting: Emergency Medicine

## 2015-09-14 DIAGNOSIS — R109 Unspecified abdominal pain: Secondary | ICD-10-CM | POA: Insufficient documentation

## 2015-09-14 DIAGNOSIS — O9989 Other specified diseases and conditions complicating pregnancy, childbirth and the puerperium: Secondary | ICD-10-CM | POA: Insufficient documentation

## 2015-09-14 DIAGNOSIS — Z3A01 Less than 8 weeks gestation of pregnancy: Secondary | ICD-10-CM | POA: Insufficient documentation

## 2015-09-14 DIAGNOSIS — O26899 Other specified pregnancy related conditions, unspecified trimester: Secondary | ICD-10-CM

## 2015-09-14 DIAGNOSIS — R1030 Lower abdominal pain, unspecified: Secondary | ICD-10-CM

## 2015-09-14 DIAGNOSIS — N898 Other specified noninflammatory disorders of vagina: Secondary | ICD-10-CM | POA: Insufficient documentation

## 2015-09-14 DIAGNOSIS — Z872 Personal history of diseases of the skin and subcutaneous tissue: Secondary | ICD-10-CM | POA: Insufficient documentation

## 2015-09-14 LAB — URINALYSIS, ROUTINE W REFLEX MICROSCOPIC
Bilirubin Urine: NEGATIVE
Glucose, UA: NEGATIVE mg/dL
Hgb urine dipstick: NEGATIVE
Ketones, ur: NEGATIVE mg/dL
NITRITE: NEGATIVE
PH: 6 (ref 5.0–8.0)
Protein, ur: NEGATIVE mg/dL
SPECIFIC GRAVITY, URINE: 1.021 (ref 1.005–1.030)

## 2015-09-14 LAB — CBC
HEMATOCRIT: 31.1 % — AB (ref 36.0–46.0)
HEMOGLOBIN: 9.7 g/dL — AB (ref 12.0–15.0)
MCH: 25.3 pg — ABNORMAL LOW (ref 26.0–34.0)
MCHC: 31.2 g/dL (ref 30.0–36.0)
MCV: 81 fL (ref 78.0–100.0)
Platelets: 239 10*3/uL (ref 150–400)
RBC: 3.84 MIL/uL — ABNORMAL LOW (ref 3.87–5.11)
RDW: 18.6 % — ABNORMAL HIGH (ref 11.5–15.5)
WBC: 9.8 10*3/uL (ref 4.0–10.5)

## 2015-09-14 LAB — COMPREHENSIVE METABOLIC PANEL
ALBUMIN: 3.7 g/dL (ref 3.5–5.0)
ALT: 13 U/L — ABNORMAL LOW (ref 14–54)
ANION GAP: 7 (ref 5–15)
AST: 22 U/L (ref 15–41)
Alkaline Phosphatase: 49 U/L (ref 38–126)
BILIRUBIN TOTAL: 0.5 mg/dL (ref 0.3–1.2)
BUN: 6 mg/dL (ref 6–20)
CHLORIDE: 105 mmol/L (ref 101–111)
CO2: 24 mmol/L (ref 22–32)
Calcium: 9.4 mg/dL (ref 8.9–10.3)
Creatinine, Ser: 0.61 mg/dL (ref 0.44–1.00)
GFR calc Af Amer: >60 mL/min (ref >60)
GLUCOSE: 94 mg/dL (ref 65–99)
POTASSIUM: 3.6 mmol/L (ref 3.5–5.1)
Sodium: 136 mmol/L (ref 135–145)
TOTAL PROTEIN: 7.4 g/dL (ref 6.5–8.1)

## 2015-09-14 LAB — URINE MICROSCOPIC-ADD ON: RBC / HPF: NONE SEEN RBC/hpf (ref 0–5)

## 2015-09-14 LAB — HCG, QUANTITATIVE, PREGNANCY: hCG, Beta Chain, Quant, S: 37232 m[IU]/mL — ABNORMAL HIGH (ref <5)

## 2015-09-14 NOTE — ED Notes (Signed)
Patient presents stating she has been having lower abd pain and cramping that started today.  Seen at Urgent Care 11/30 and had a positive pregnancy test.  "White to light brown vaginal discharge".  Denies urinary symptoms

## 2015-09-15 ENCOUNTER — Emergency Department (HOSPITAL_COMMUNITY): Payer: No Typology Code available for payment source

## 2015-09-15 LAB — GC/CHLAMYDIA PROBE AMP (~~LOC~~) NOT AT ARMC
Chlamydia: NEGATIVE
NEISSERIA GONORRHEA: NEGATIVE

## 2015-09-15 LAB — RPR: RPR Ser Ql: NONREACTIVE

## 2015-09-15 LAB — WET PREP, GENITAL
Sperm: NONE SEEN
Trich, Wet Prep: NONE SEEN
YEAST WET PREP: NONE SEEN

## 2015-09-15 NOTE — ED Notes (Signed)
MD at bedside to ultrasound pelvis.

## 2015-09-15 NOTE — ED Provider Notes (Signed)
CSN: [id number]     Arrival date & time 09/14/15  1928 History   By signing my name below, I, Arlan Organ, attest that this documentation has been prepared under the direction and in the presence of Shon Baton, MD.  Electronically Signed: Arlan Organ, ED Scribe. 09/15/2015. 12:46 AM.   Chief Complaint  Patient presents with  . Abdominal Pain   The history is provided by the patient. No language interpreter was used.    HPI Comments: Lisa Santana G3P0 currently pregnant is a 24 y.o. female without any pertinent past medical history who presents to the Emergency Department complaining of constant, ongoing lower abdominal pain x 1 day. She described pain as dull and currently rates discomfort 3/10. No aggravating or alleviating factors at this time. Pt also reports some white colored vaginal discharge. No OTC medications or home remedies attempted prior to arrival. No recent fever, chills, nausea, vomiting, chest pain , or shortness of breath. No vaginal pain or vaginal bleeding. LNMP 10/24. Pt [facility] and was told she was approximately [redacted] weeks pregnant. First follow up OB/GYN in 2 days.  PCP: No PCP Per Patient    Past Medical History  Diagnosis Date  . Acne vulgaris     Accutane in 2014.   Past Surgical History  Procedure Laterality Date  . Joint replacement     Family History  Problem Relation Age of Onset  . Hypertension Mother    Social History  Substance Use Topics  . Smoking status: Never Smoker   . Smokeless tobacco: Never Used  . Alcohol Use: No   OB History    No data available     Review of Systems  Constitutional: Negative for fever and chills.  Respiratory: Negative for shortness of breath.   Cardiovascular: Negative for chest pain.  Gastrointestinal: Positive for abdominal pain. Negative for nausea and vomiting.  Genitourinary: Positive for vaginal discharge. Negative for dysuria, vaginal bleeding and vaginal  pain.  Musculoskeletal: Negative for back pain.  Neurological: Negative for headaches.  Psychiatric/Behavioral: Negative for confusion.  All other systems reviewed and are negative.     Allergies  Review of patient's allergies indicates no known allergies.  Home Medications   Prior to Admission medications   Medication Sig Start Date End Date Taking? Authorizing Provider  valACYclovir (VALTREX) 500 MG tablet Take 500 mg by mouth 3 (three) times daily as needed (outbreak).   Yes Historical Provider, MD   Triage Vitals: BP 108/55 mmHg  Pulse 73  Temp(Src) 98.5 F (36.9 C) (Oral)  Resp 14  Ht  (1.651 m)  Wt 152 lb 1.6 oz (68.992 kg)  BMI 25.31 kg/m2  SpO2 100%  LMP 07/27/2015   Physical Exam  Constitutional: She is oriented to person, place, and time. She appears well-developed and well-nourished. No distress.  HENT:  Head: Normocephalic and atraumatic.  Cardiovascular: Normal rate, regular rhythm and normal heart sounds.   No murmur heard. Pulmonary/Chest: Effort normal and breath sounds normal. No respiratory distress. She has no wheezes.  Abdominal: Soft. Bowel sounds are normal. There is no tenderness. There is no rebound and no guarding.  Genitourinary:  Normal external vaginal exam, skin tag noted, no lesions noted, moderate vaginal discharge noted, cervical os closed, no adnexal or cervical motion tenderness  Neurological: She is alert and oriented to person, place, and time.  Skin: Skin is warm and dry.  Psychiatric: She has a normal mood and  affect.  Nursing note and vitals reviewed.   ED Course  Procedures (including critical care time)  DIAGNOSTIC STUDIES: Oxygen Saturation is 100% on RA, Normal by my interpretation.    COORDINATION OF CARE: 12:28 AM- Will order RPR, HIV antibody, CMP, CBC, urinalysis, hCG, and wet prep. Discussed treatment plan with pt at bedside and pt agreed to plan.     Labs Review Labs Reviewed  WET PREP, GENITAL - Abnormal;  Notable for the following:    Clue Cells Wet Prep HPF POC PRESENT (*)    WBC, Wet Prep HPF POC MODERATE (*)    All other components within normal limits  COMPREHENSIVE METABOLIC PANEL - Abnormal; Notable for the following:    ALT 13 (*)    All other components within normal limits  CBC - Abnormal; Notable for the following:    RBC 3.84 (*)    Hemoglobin 9.7 (*)    HCT 31.1 (*)    MCH 25.3 (*)    RDW 18.6 (*)    All other components within normal limits  URINALYSIS, ROUTINE W REFLEX MICROSCOPIC (NOT AT [facility]) - Abnormal; Notable for the following:    APPearance CLOUDY (*)    Leukocytes, UA TRACE (*)    All other components within normal limits  HCG, QUANTITATIVE, PREGNANCY - Abnormal; Notable for the following:    hCG, Beta Chain, Quant, S [id number] (*)    All other components within normal limits  URINE MICROSCOPIC-ADD ON - Abnormal; Notable for the following:    Squamous Epithelial / LPF 6-30 (*)    Bacteria, UA MANY (*)    All other components within normal limits  RPR  HIV ANTIBODY (ROUTINE TESTING)  GC/CHLAMYDIA PROBE AMP ([place]) NOT AT [facility]    Imaging Review [place] Ob Comp Less 14 Wks  09/15/2015  CLINICAL DATA:  Abdominal pain during pregnancy. Estimated gestational age by LMP is 7 weeks 1 day. Quantitative beta HCG is 37,232. EXAM: OBSTETRIC <14 WK US AND TRANSVAGINAL OB US TECHNIQUE: Both transabdominal and transvaginal ultrasound examinations were performed for complete evaluation of the gestation as well as the maternal uterus, adnexal regions, and pelvic cul-de-sac. Transvaginal technique was performed to assess early pregnancy. COMPARISON:  None. FINDINGS: Intrauterine gestational sac: A single intrauterine pregnancy is identified. Yolk sac:  Yolk sac is visualized. Embryo:  Fetal pole is visualized. Cardiac Activity: Fetal cardiac activity is observed. Heart Rate: 139  bpm CRL:  10.1  mm   7 w   1 d                  US EDC: 05/02/2016 Maternal uterus/adnexae: Uterus is  anteverted. No myometrial mass lesions identified. No subchorionic hemorrhage. Cervix appears intact. Both ovaries are visualized. No abnormal adnexal mass lesions are identified. No significant free fluid in the pelvis. IMPRESSION: Single intrauterine pregnancy. Estimated gestational age by crown-rump length is 7 weeks 1 day. No acute complication suggested. Electronically Signed   By: Burman NievesWilliam  Stevens M.D.   On: 09/15/2015 02:22   [place] Ob Transvaginal  09/15/2015  CLINICAL DATA:  Abdominal pain during pregnancy. Estimated gestational age by LMP is 7 weeks 1 day. Quantitative beta HCG is 37,232. EXAM: OBSTETRIC <14 WK US AND TRANSVAGINAL OB US TECHNIQUE: Both transabdominal and transvaginal ultrasound examinations were performed for complete evaluation of the gestation as well as the maternal uterus, adnexal regions, and pelvic cul-de-sac. Transvaginal technique was performed to assess early pregnancy. COMPARISON:  None. FINDINGS: Intrauterine gestational sac: A single  intrauterine pregnancy is identified. Yolk sac:  Yolk sac is visualized. Embryo:  Fetal pole is visualized. Cardiac Activity: Fetal cardiac activity is observed. Heart Rate: 139  bpm CRL:  10.1  mm   7 w   1 d                  [place] EDC: 05/02/2016 Maternal uterus/adnexae: Uterus is anteverted. No myometrial mass lesions identified. No subchorionic hemorrhage. Cervix appears intact. Both ovaries are visualized. No abnormal adnexal mass lesions are identified. No significant free fluid in the pelvis. IMPRESSION: Single intrauterine pregnancy. Estimated gestational age by crown-rump length is 7 weeks 1 day. No acute complication suggested. Electronically Signed   By: Burman Nieves M.D.   On: 09/15/2015 02:22   I have personally reviewed and evaluated these images and lab results as part of my medical decision-making.   EKG Interpretation None      MDM   Final diagnoses:  Abdominal pain during pregnancy    Patient presents with  abdominal pain during pregnancy. On toxic on exam. Nontender. Exam is reassuring. I attempted bedside ultrasound; while I was able to identify what appeared to be an intrauterine pregnancy, I was unable to identify heartbeat as resolution was poor. Patient sent over for formal ultrasound to confirm intrauterine pregnancy.  She declines STD treatment at this time. Ultrasound shows a 7 week 1 day fetus without complication. Patient was given miscarriage precautions. Follow-up with OB as scheduled.  After history, exam, and medical workup I feel the patient has been appropriately medically screened and is safe for discharge home. Pertinent diagnoses were discussed with the patient. Patient was given return precautions.  I personally performed the services described in this documentation, which was scribed in my presence. The recorded information has been reviewed and is accurate.   Shon Baton, MD 09/15/15 [phone]

## 2015-09-15 NOTE — Discharge Instructions (Signed)

## 2015-09-17 LAB — HIV ANTIBODY (ROUTINE TESTING W REFLEX): HIV SCREEN 4TH GENERATION: NONREACTIVE

## 2015-10-04 NOTE — L&D Delivery Note (Signed)
Delivery Note  Patient was noted to be C/C/+2. There was fetal bradycardia nadir 80s to 90s.   Discussed with patient operative delivery with a vacuum. Verbal consent: obtained from patient. Risks and benefits discussed in detail.  Risks include, but are not limited to the risks of anesthesia, bleeding, infection, damage to maternal tissues, fetal cephalhematoma.  There is also the risk of inability to effect vaginal delivery of the head, or shoulder dystocia that cannot be resolved by established maneuvers, leading to the need for emergency cesarean section.  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 Bladder was just previously emptied by removing the foley catheter   Epidural was found to be adequate.  Vertex was +3 station.   The Kiwi vacuum was placed.  With maternal effort and 2 pulls,  A viable and healthy female was delivered via Vaginal, Vacuum Investment banker, operational).  Presentation: vertex; Position: Occiput,, Anterior; shoulders and body were easily delivered. Cord was double clamped and cut and the infant handed to the waiting Pediatricians.  Infant was noted to be moving all four extremities and crying vigorously. Cord gases and cord blood was obtained. The placenta delivered spontaneously intact 3 vessels noted.     Uterine atony was alleviated by massage and  IV pitocin, The vagina was cleared of all clots and debris. A second degree perineal / vaginal laceration was repaired in routine fashion with a 2-0 vicryl and 3-0 chromic.   The patient tolerated delivery well.  EBL: 300 mL Apgars: 8/9 Fetus: female, skin to skin with mother  Wynonia Hazard

## 2016-04-07 ENCOUNTER — Other Ambulatory Visit: Payer: Self-pay | Admitting: Obstetrics and Gynecology

## 2016-04-18 ENCOUNTER — Inpatient Hospital Stay (HOSPITAL_COMMUNITY)
Admission: AD | Admit: 2016-04-18 | Discharge: 2016-04-18 | Disposition: A | Discharge status: Home / Self Care | Payer: No Typology Code available for payment source | Source: Ambulatory Visit | Attending: Obstetrics and Gynecology | Admitting: Obstetrics and Gynecology

## 2016-04-18 ENCOUNTER — Inpatient Hospital Stay (HOSPITAL_COMMUNITY): Payer: Self-pay | Admitting: *Deleted

## 2016-04-18 DIAGNOSIS — O2343 Unspecified infection of urinary tract in pregnancy, third trimester: Secondary | ICD-10-CM | POA: Insufficient documentation

## 2016-04-18 DIAGNOSIS — B373 Candidiasis of vulva and vagina: Secondary | ICD-10-CM | POA: Diagnosis not present

## 2016-04-18 DIAGNOSIS — Z3A38 38 weeks gestation of pregnancy: Secondary | ICD-10-CM | POA: Insufficient documentation

## 2016-04-18 DIAGNOSIS — L292 Pruritus vulvae: Secondary | ICD-10-CM | POA: Diagnosis present

## 2016-04-18 DIAGNOSIS — N898 Other specified noninflammatory disorders of vagina: Secondary | ICD-10-CM

## 2016-04-18 DIAGNOSIS — O98813 Other maternal infectious and parasitic diseases complicating pregnancy, third trimester: Secondary | ICD-10-CM | POA: Insufficient documentation

## 2016-04-18 DIAGNOSIS — B3731 Acute candidiasis of vulva and vagina: Secondary | ICD-10-CM

## 2016-04-18 LAB — URINE MICROSCOPIC-ADD ON: RBC / HPF: NONE SEEN RBC/hpf (ref 0–5)

## 2016-04-18 LAB — URINALYSIS, ROUTINE W REFLEX MICROSCOPIC
BILIRUBIN URINE: NEGATIVE
Glucose, UA: NEGATIVE mg/dL
KETONES UR: NEGATIVE mg/dL
NITRITE: NEGATIVE
Protein, ur: NEGATIVE mg/dL
Specific Gravity, Urine: <1.005 — ABNORMAL LOW (ref 1.005–1.030)
pH: 6.5 (ref 5.0–8.0)

## 2016-04-18 LAB — WET PREP, GENITAL
SPERM: NONE SEEN
Trich, Wet Prep: NONE SEEN

## 2016-04-18 MED ORDER — CEPHALEXIN 500 MG PO CAPS
500.0000 mg | ORAL_CAPSULE | Freq: Four times a day (QID) | ORAL | Status: DC
Start: 2016-04-18 — End: 2016-05-03

## 2016-04-18 MED ORDER — FLUCONAZOLE 150 MG PO TABS
150.0000 mg | ORAL_TABLET | Freq: Once | ORAL | Status: AC
  Administered 2016-04-18: 150 mg via ORAL
  Filled 2016-04-18: qty 1

## 2016-04-18 MED ORDER — FLUCONAZOLE 150 MG PO TABS: 150.0000 mg | ORAL_TABLET | Freq: Every day | ORAL | Status: DC

## 2016-04-18 NOTE — Discharge Instructions (Signed)
Pregnancy and Urinary Tract Infection °A urinary tract infection (UTI) is a bacterial infection of the urinary tract. Infection of the urinary tract can include the ureters, kidneys (pyelonephritis), bladder (cystitis), and urethra (urethritis). All pregnant women should be screened for bacteria in the urinary tract. Identifying and treating a UTI will decrease the risk of preterm labor and developing more serious infections in both the mother and baby. °CAUSES °Bacteria germs cause almost all UTIs.  °RISK FACTORS °Many factors can increase your chances of getting a UTI during pregnancy. These include: °· Having a short urethra. °· Poor toilet and hygiene habits. °· Sexual intercourse. °· Blockage of urine along the urinary tract. °· Problems with the pelvic muscles or nerves. °· Diabetes. °· Obesity. °· Bladder problems after having several children. °· Previous history of UTI. °SIGNS AND SYMPTOMS  °· Pain, burning, or a stinging feeling when urinating. °· Suddenly feeling the need to urinate right away (urgency). °· Loss of bladder control (urinary incontinence). °· Frequent urination, more than is common with pregnancy. °· Lower abdominal or back discomfort. °· Cloudy urine. °· Blood in the urine (hematuria). °· Fever.  °When the kidneys are infected, the symptoms may be: °· Back pain. °· Flank pain on the right side more so than the left. °· Fever. °· Chills. °· Nausea. °· Vomiting. °DIAGNOSIS  °A urinary tract infection is usually diagnosed through urine tests. Additional tests and procedures are sometimes done. These may include: °· Ultrasound exam of the kidneys, ureters, bladder, and urethra. °· Looking in the bladder with a lighted tube (cystoscopy). °TREATMENT °Typically, UTIs can be treated with antibiotic medicines.  °HOME CARE INSTRUCTIONS  °· Only take over-the-counter or prescription medicines as directed by your health care provider. If you were prescribed antibiotics, take them as directed. Finish  them even if you start to feel better. °· Drink enough fluids to keep your urine clear or pale yellow. °· Do not have sexual intercourse until the infection is gone and your health care provider says it is okay. °· Make sure you are tested for UTIs throughout your pregnancy. These infections often come back.  °Preventing a UTI in the Future °· Practice good toilet habits. Always wipe from front to back. Use the tissue only once. °· Do not hold your urine. Empty your bladder as soon as possible when the urge comes. °· Do not douche or use deodorant sprays. °· Wash with soap and warm water around the genital area and the anus. °· Empty your bladder before and after sexual intercourse. °· Wear underwear with a cotton crotch. °· Avoid caffeine and carbonated drinks. They can irritate the bladder. °· Drink cranberry juice or take cranberry pills. This may decrease the risk of getting a UTI. °· Do not drink alcohol. °· Keep all your appointments and tests as scheduled.  °SEEK MEDICAL CARE IF:  °· Your symptoms get worse. °· You are still having fevers 2 or more days after treatment begins. °· You have a rash. °· You feel that you are having problems with medicines prescribed. °· You have abnormal vaginal discharge. °SEEK IMMEDIATE MEDICAL CARE IF:  °· You have back or flank pain. °· You have chills. °· You have blood in your urine. °· You have nausea and vomiting. °· You have contractions of your uterus. °· You have a gush of fluid from the vagina. °MAKE SURE YOU: °· Understand these instructions.   °· Will watch your condition.   °· Will get help right away if you are not doing   well or get worse.    This information is not intended to replace advice given to you by your health care provider. Make sure you discuss any questions you have with your health care provider.   Document Released: 01/14/2011 Document Revised: 07/10/2013 Document Reviewed: 04/18/2013 Elsevier Interactive Patient Education 2016 Elsevier  Inc. Monilial Vaginitis Vaginitis in a soreness, swelling and redness (inflammation) of the vagina and vulva. Monilial vaginitis is not a sexually transmitted infection. CAUSES  Yeast vaginitis is caused by yeast (candida) that is normally found in your vagina. With a yeast infection, the candida has overgrown in number to a point that upsets the chemical balance. SYMPTOMS   White, thick vaginal discharge.  Swelling, itching, redness and irritation of the vagina and possibly the lips of the vagina (vulva).  Burning or painful urination.  Painful intercourse. DIAGNOSIS  Things that may contribute to monilial vaginitis are:  Postmenopausal and virginal states.  Pregnancy.  Infections.  Being tired, sick or stressed, especially if you had monilial vaginitis in the past.  Diabetes. Good control will help lower the chance.  Birth control pills.  Tight fitting garments.  Using bubble bath, feminine sprays, douches or deodorant tampons.  Taking certain medications that kill germs (antibiotics).  Sporadic recurrence can occur if you become ill. TREATMENT  Your caregiver will give you medication.  There are several kinds of anti monilial vaginal creams and suppositories specific for monilial vaginitis. For recurrent yeast infections, use a suppository or cream in the vagina 2 times a week, or as directed.  Anti-monilial or steroid cream for the itching or irritation of the vulva may also be used. Get your caregiver's permission.  Painting the vagina with methylene blue solution may help if the monilial cream does not work.  Eating yogurt may help prevent monilial vaginitis. HOME CARE INSTRUCTIONS   Finish all medication as prescribed.  Do not have sex until treatment is completed or after your caregiver tells you it is okay.  Take warm sitz baths.  Do not douche.  Do not use tampons, especially scented ones.  Wear cotton underwear.  Avoid tight pants and panty  hose.  Tell your sexual partner that you have a yeast infection. They should go to their caregiver if they have symptoms such as mild rash or itching.  Your sexual partner should be treated as well if your infection is difficult to eliminate.  Practice safer sex. Use condoms.  Some vaginal medications cause latex condoms to fail. Vaginal medications that harm condoms are:  Cleocin cream.  Butoconazole (Femstat).  Terconazole (Terazol) vaginal suppository.  Miconazole (Monistat) (may be purchased over the counter). SEEK MEDICAL CARE IF:   You have a temperature by mouth above 102 F (38.9 C).  The infection is getting worse after 2 days of treatment.  The infection is not getting better after 3 days of treatment.  You develop blisters in or around your vagina.  You develop vaginal bleeding, and it is not your menstrual period.  You have pain when you urinate.  You develop intestinal problems.  You have pain with sexual intercourse.   This information is not intended to replace advice given to you by your health care provider. Make sure you discuss any questions you have with your health care provider.   Document Released: 06/29/2005 Document Revised: 12/12/2011 Document Reviewed: 03/23/2015 Elsevier Interactive Patient Education [employer]2016 Elsevier Inc.

## 2016-04-18 NOTE — MAU Note (Signed)
Pt states that she has been recently diagnosed with BV and last Wednesday she started to fell that her vagina was feeling swollen with discomfort and burning with urination. Pt states that she started feeling this after starting her medication for the BV treatment. Denies any labor complaints at this time. Carmelina DaneERRI L Lamarius Dirr, RN

## 2016-04-18 NOTE — MAU Note (Signed)
Having vaginal irritation, feels inflamed and enlarged.  Is due is in a few days.  Hx of herpes, is afraid she will need a c/s.  Having a lot of d/c. Started last week on Valtrex and treatment for BV

## 2016-04-18 NOTE — MAU Provider Note (Signed)
History     CSN: [id number]  Arrival date and time: 04/18/16 1728   First Provider Initiated Contact with Patient 04/18/16 1852      Chief Complaint  Patient presents with  . Vaginal Itching   HPI   Lisa Santana is a 25 y.o. 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 The symptoms started last week. In the last 2 weeks she was treated for BV, however this irritation feels different. She has two days remaining on her metrogel.  She complains of overall irritation with vaginal itching. She is concerned about an outbreak.   She was taking suppressive therapy however stopped in on Friday due to the irritation she was experiencing. She was not sure what was causing the irritation.   OB History    Gravida Para Term Preterm AB TAB SAB Ectopic Multiple Living   1               Past Medical History  Diagnosis Date  . Acne vulgaris     Accutane in 2014.    Past Surgical History  Procedure Laterality Date  . Joint replacement      Family History  Problem Relation Age of Onset  . Hypertension Mother     Social History  Substance Use Topics  . Smoking status: Never Smoker   . Smokeless tobacco: Never Used  . Alcohol Use: No    Allergies: No Known Allergies  Prescriptions prior to admission  Medication Sig Dispense Refill Last Dose  . docusate sodium (COLACE) 100 MG capsule Take 100 mg by mouth 2 (two) times daily as needed for mild constipation.   Past Week at Unknown time  . valACYclovir (VALTREX) 500 MG tablet Take 500 mg by mouth 2 (two) times daily.    Past Week at Unknown time   Results for orders placed or performed during the hospital encounter of 04/18/16 (from the past 48 hour(s))  Urinalysis, Routine w reflex microscopic (not at [facility])     Status: Abnormal   Collection Time: 04/18/16  6:04 PM  Result Value Ref Range   Color, Urine YELLOW YELLOW   APPearance HAZY (A) CLEAR   Specific Gravity, Urine <1.005 (L) 1.005 - 1.030    pH 6.5 5.0 - 8.0   Glucose, UA NEGATIVE NEGATIVE mg/dL   Hgb urine dipstick TRACE (A) NEGATIVE   Bilirubin Urine NEGATIVE NEGATIVE   Ketones, ur NEGATIVE NEGATIVE mg/dL   Protein, ur NEGATIVE NEGATIVE mg/dL   Nitrite NEGATIVE NEGATIVE   Leukocytes, UA LARGE (A) NEGATIVE  Urine microscopic-add on     Status: Abnormal   Collection Time: 04/18/16  6:04 PM  Result Value Ref Range   Squamous Epithelial / LPF 0-5 (A) NONE SEEN   WBC, UA 0-5 0 - 5 WBC/hpf   RBC / HPF NONE SEEN 0 - 5 RBC/hpf   Bacteria, UA FEW (A) NONE SEEN   Urine-Other YEAST PRESENT   Wet prep, genital     Status: Abnormal   Collection Time: 04/18/16  7:00 PM  Result Value Ref Range   Yeast Wet Prep HPF POC PRESENT (A) NONE SEEN   Trich, Wet Prep NONE SEEN NONE SEEN   Clue Cells Wet Prep HPF POC PRESENT (A) NONE SEEN   WBC, Wet Prep HPF POC MANY (A) NONE SEEN    Comment: MANY BACTERIA SEEN   Sperm NONE SEEN     Review of Systems  Constitutional: Negative for fever and chills.  Gastrointestinal: Negative for abdominal pain.  Genitourinary: Positive for dysuria, urgency and frequency. Negative for hematuria and flank pain.   Physical Exam   Blood pressure 124/75, pulse 74, temperature 98.5 F (36.9 C), resp. rate 18, last menstrual period 07/27/2015.  Physical Exam  Genitourinary: There is tenderness (Edema, and erythema ) on the right labia. There is no lesion on the right labia. There is tenderness on the left labia. There is no lesion on the left labia. Cervix exhibits discharge. Cervix exhibits no motion tenderness and no friability. There is erythema and tenderness in the vagina. No bleeding in the vagina. Vaginal discharge found.  Speculum exam: Vagina - Large amount of thick white discharge adhered to vaginal wall , no odor Cervix - No contact bleeding Bimanual exam: Dilation: Closed Exam by:: Venia Carbon, NP Wet prep done Chaperone present for exam.   Fetal Tracing: Baseline: 125 bpm   Variability: Moderate  Accelerations: 15x15 Decelerations: none  Toco: UI  MAU Course  Procedures  None  MDM  Discussed patient with Dr. Tenny Craw Will give one dose of diflucan here, and plan to have the patient repeat the dose in 3 days.  Diflucan given 150 mg PO Urine culture pending   Assessment and Plan   A:  1. Yeast vaginitis   2. UTI in pregnancy, antepartum, third trimester   3. Vaginal irritation     P:  Discharge home in stable condition  Rx: Diflucan to repeat in 3 days, keflex Urine culture pending  Discussed the importance of taking Valtrex for suppression  Return to MAU for emergencies  Follow up with OB as scheduled or sooner if needed Fetal kick counts Labor precautions.    Duane Lope, NP 04/19/2016 8:48 AM

## 2016-04-20 LAB — URINE CULTURE: Special Requests: NORMAL

## 2016-04-21 ENCOUNTER — Other Ambulatory Visit (HOSPITAL_COMMUNITY): Payer: Self-pay | Admitting: Advanced Practice Midwife

## 2016-05-01 ENCOUNTER — Inpatient Hospital Stay (HOSPITAL_COMMUNITY): Payer: 59 | Admitting: Anesthesiology

## 2016-05-01 ENCOUNTER — Encounter (HOSPITAL_COMMUNITY): Payer: Self-pay | Admitting: *Deleted

## 2016-05-01 ENCOUNTER — Inpatient Hospital Stay (HOSPITAL_COMMUNITY)
Admission: AD | Admit: 2016-05-01 | Discharge: 2016-05-03 | DRG: 775 | Disposition: A | Discharge status: Home / Self Care | Payer: 59 | Source: Ambulatory Visit | Attending: Obstetrics & Gynecology | Admitting: Obstetrics & Gynecology

## 2016-05-01 DIAGNOSIS — Z8249 Family history of ischemic heart disease and other diseases of the circulatory system: Secondary | ICD-10-CM | POA: Diagnosis present

## 2016-05-01 DIAGNOSIS — Z8759 Personal history of other complications of pregnancy, childbirth and the puerperium: Secondary | ICD-10-CM

## 2016-05-01 DIAGNOSIS — O99824 Streptococcus B carrier state complicating childbirth: Secondary | ICD-10-CM | POA: Diagnosis present

## 2016-05-01 DIAGNOSIS — Z3A39 39 weeks gestation of pregnancy: Secondary | ICD-10-CM | POA: Diagnosis not present

## 2016-05-01 DIAGNOSIS — Z9660 Presence of unspecified orthopedic joint implant: Secondary | ICD-10-CM | POA: Diagnosis present

## 2016-05-01 DIAGNOSIS — Z9889 Other specified postprocedural states: Secondary | ICD-10-CM | POA: Diagnosis not present

## 2016-05-01 DIAGNOSIS — IMO0001 Reserved for inherently not codable concepts without codable children: Secondary | ICD-10-CM

## 2016-05-01 HISTORY — DX: Herpesviral infection of urogenital system, unspecified: A60.00

## 2016-05-01 LAB — CBC
HEMATOCRIT: 30.1 % — AB (ref 36.0–46.0)
Hemoglobin: 9.4 g/dL — ABNORMAL LOW (ref 12.0–15.0)
MCH: 24.4 pg — AB (ref 26.0–34.0)
MCHC: 31.2 g/dL (ref 30.0–36.0)
MCV: 78.2 fL (ref 78.0–100.0)
Platelets: 188 10*3/uL (ref 150–400)
RBC: 3.85 MIL/uL — ABNORMAL LOW (ref 3.87–5.11)
RDW: 17.1 % — AB (ref 11.5–15.5)
WBC: 9.5 10*3/uL (ref 4.0–10.5)

## 2016-05-01 LAB — TYPE AND SCREEN
ABO/RH(D): O POS
ANTIBODY SCREEN: NEGATIVE

## 2016-05-01 LAB — POCT FERN TEST: POCT Fern Test: POSITIVE

## 2016-05-01 LAB — ABO/RH: ABO/RH(D): O POS

## 2016-05-01 LAB — HEPATITIS B SURFACE ANTIGEN: Hepatitis B Surface Ag: NEGATIVE

## 2016-05-01 MED ORDER — OXYCODONE-ACETAMINOPHEN 5-325 MG PO TABS: 1.0000 | ORAL_TABLET | ORAL | Status: DC | PRN

## 2016-05-01 MED ORDER — LIDOCAINE HCL (PF) 1 % IJ SOLN
30.0000 mL | INTRAMUSCULAR | Status: DC | PRN
  Filled 2016-05-01: qty 30

## 2016-05-01 MED ORDER — LACTATED RINGERS IV SOLN
500.0000 mL | INTRAVENOUS | Status: DC | PRN
  Administered 2016-05-01: 500 mL via INTRAVENOUS

## 2016-05-01 MED ORDER — TERBUTALINE SULFATE 1 MG/ML IJ SOLN
0.2500 mg | Freq: Once | INTRAMUSCULAR | Status: DC | PRN
  Filled 2016-05-01: qty 1

## 2016-05-01 MED ORDER — ZOLPIDEM TARTRATE 5 MG PO TABS: 5.0000 mg | ORAL_TABLET | Freq: Every evening | ORAL | Status: DC | PRN

## 2016-05-01 MED ORDER — ONDANSETRON HCL 4 MG/2ML IJ SOLN: 4.0000 mg | Freq: Four times a day (QID) | INTRAMUSCULAR | Status: DC | PRN

## 2016-05-01 MED ORDER — ACETAMINOPHEN 325 MG PO TABS: 650.0000 mg | ORAL_TABLET | ORAL | Status: DC | PRN

## 2016-05-01 MED ORDER — LACTATED RINGERS IV SOLN
500.0000 mL | Freq: Once | INTRAVENOUS | Status: AC
  Administered 2016-05-01: 500 mL via INTRAVENOUS

## 2016-05-01 MED ORDER — OXYTOCIN BOLUS FROM INFUSION
500.0000 mL | Freq: Once | INTRAVENOUS | Status: AC
  Administered 2016-05-01: 500 mL via INTRAVENOUS

## 2016-05-01 MED ORDER — SIMETHICONE 80 MG PO CHEW: 80.0000 mg | CHEWABLE_TABLET | ORAL | Status: DC | PRN

## 2016-05-01 MED ORDER — PHENYLEPHRINE 40 MCG/ML (10ML) SYRINGE FOR IV PUSH (FOR BLOOD PRESSURE SUPPORT)
80.0000 ug | PREFILLED_SYRINGE | INTRAVENOUS | Status: DC | PRN
  Filled 2016-05-01: qty 5

## 2016-05-01 MED ORDER — ONDANSETRON HCL 4 MG/2ML IJ SOLN: 4.0000 mg | INTRAMUSCULAR | Status: DC | PRN

## 2016-05-01 MED ORDER — LACTATED RINGERS IV SOLN: INTRAVENOUS | Status: DC

## 2016-05-01 MED ORDER — EPHEDRINE 5 MG/ML INJ
10.0000 mg | INTRAVENOUS | Status: DC | PRN
  Filled 2016-05-01: qty 4

## 2016-05-01 MED ORDER — LIDOCAINE HCL (PF) 1 % IJ SOLN
INTRAMUSCULAR | Status: DC | PRN
  Administered 2016-05-01 (×2): 4 mL

## 2016-05-01 MED ORDER — OXYCODONE-ACETAMINOPHEN 5-325 MG PO TABS: 2.0000 | ORAL_TABLET | ORAL | Status: DC | PRN

## 2016-05-01 MED ORDER — DIPHENHYDRAMINE HCL 50 MG/ML IJ SOLN: 12.5000 mg | INTRAMUSCULAR | Status: DC | PRN

## 2016-05-01 MED ORDER — FLEET ENEMA 7-19 GM/118ML RE ENEM: 1.0000 | ENEMA | RECTAL | Status: DC | PRN

## 2016-05-01 MED ORDER — DIBUCAINE 1 % RE OINT: 1.0000 "application " | TOPICAL_OINTMENT | RECTAL | Status: DC | PRN

## 2016-05-01 MED ORDER — IBUPROFEN 600 MG PO TABS
600.0000 mg | ORAL_TABLET | Freq: Four times a day (QID) | ORAL | Status: DC
  Administered 2016-05-02 – 2016-05-03 (×7): 600 mg via ORAL
  Filled 2016-05-01 (×7): qty 1

## 2016-05-01 MED ORDER — OXYTOCIN 40 UNITS IN LACTATED RINGERS INFUSION - SIMPLE MED
1.0000 m[IU]/min | INTRAVENOUS | Status: DC
  Administered 2016-05-01: 2 m[IU]/min via INTRAVENOUS
  Filled 2016-05-01: qty 1000

## 2016-05-01 MED ORDER — DIPHENHYDRAMINE HCL 25 MG PO CAPS: 25.0000 mg | ORAL_CAPSULE | Freq: Four times a day (QID) | ORAL | Status: DC | PRN

## 2016-05-01 MED ORDER — COCONUT OIL OIL: 1.0000 "application " | TOPICAL_OIL | Status: DC | PRN

## 2016-05-01 MED ORDER — ONDANSETRON HCL 4 MG PO TABS: 4.0000 mg | ORAL_TABLET | ORAL | Status: DC | PRN

## 2016-05-01 MED ORDER — PHENYLEPHRINE 40 MCG/ML (10ML) SYRINGE FOR IV PUSH (FOR BLOOD PRESSURE SUPPORT)
80.0000 ug | PREFILLED_SYRINGE | INTRAVENOUS | Status: DC | PRN
  Filled 2016-05-01: qty 10
  Filled 2016-05-01: qty 5

## 2016-05-01 MED ORDER — OXYTOCIN 40 UNITS IN LACTATED RINGERS INFUSION - SIMPLE MED
2.5000 [IU]/h | INTRAVENOUS | Status: DC
  Administered 2016-05-01: 2.5 [IU]/h via INTRAVENOUS

## 2016-05-01 MED ORDER — PRENATAL MULTIVITAMIN CH
1.0000 | ORAL_TABLET | Freq: Every day | ORAL | Status: DC
  Administered 2016-05-02 – 2016-05-03 (×2): 1 via ORAL
  Filled 2016-05-01 (×4): qty 1

## 2016-05-01 MED ORDER — SODIUM CHLORIDE 0.9 % IV SOLN
2.0000 g | Freq: Once | INTRAVENOUS | Status: AC
  Administered 2016-05-01: 2 g via INTRAVENOUS
  Filled 2016-05-01: qty 2000

## 2016-05-01 MED ORDER — WITCH HAZEL-GLYCERIN EX PADS: 1.0000 "application " | MEDICATED_PAD | CUTANEOUS | Status: DC | PRN

## 2016-05-01 MED ORDER — FENTANYL 2.5 MCG/ML BUPIVACAINE 1/10 % EPIDURAL INFUSION (WH - ANES)
14.0000 mL/h | INTRAMUSCULAR | Status: DC | PRN
  Administered 2016-05-01: 14 mL/h via EPIDURAL
  Filled 2016-05-01: qty 125

## 2016-05-01 MED ORDER — SOD CITRATE-CITRIC ACID 500-334 MG/5ML PO SOLN
30.0000 mL | ORAL | Status: DC | PRN
  Filled 2016-05-01: qty 15

## 2016-05-01 MED ORDER — SODIUM CHLORIDE 0.9 % IV SOLN
1.0000 g | INTRAVENOUS | Status: DC
  Administered 2016-05-01: 1 g via INTRAVENOUS
  Filled 2016-05-01 (×3): qty 1000

## 2016-05-01 MED ORDER — SENNOSIDES-DOCUSATE SODIUM 8.6-50 MG PO TABS
2.0000 | ORAL_TABLET | ORAL | Status: DC
  Administered 2016-05-02 – 2016-05-03 (×2): 2 via ORAL
  Filled 2016-05-01 (×2): qty 2

## 2016-05-01 MED ORDER — BENZOCAINE-MENTHOL 20-0.5 % EX AERO
1.0000 "application " | INHALATION_SPRAY | CUTANEOUS | Status: DC | PRN
  Filled 2016-05-01: qty 56

## 2016-05-01 MED ORDER — TETANUS-DIPHTH-ACELL PERTUSSIS 5-2.5-18.5 LF-MCG/0.5 IM SUSP: 0.5000 mL | Freq: Once | INTRAMUSCULAR | Status: DC

## 2016-05-01 NOTE — Anesthesia Preprocedure Evaluation (Signed)

## 2016-05-01 NOTE — Anesthesia Pain Management Evaluation Note (Signed)
  CRNA Pain Management Visit Note  Patient: Lisa Santana, 25 y.o., female  "Hello I am a member of the anesthesia team at [facility]. We have an anesthesia team available at all times to provide care throughout the hospital, including epidural management and anesthesia for C-section. I don't know your plan for the delivery whether it a natural birth, water birth, IV sedation, nitrous supplementation, doula or epidural, but we want to meet your pain goals."   1.Was your pain managed to your expectations on prior hospitalizations?     2.What is your expectation for pain management during this hospitalization?    3.How can we help you reach that goal?   Record the patient's initial score and the patient's pain goal.   Pain: 4  Pain Goal:4 [facility] wants you to be able to say your pain was always managed very well.  [name] 05/01/2016

## 2016-05-01 NOTE — Anesthesia Procedure Notes (Signed)
Epidural Patient location during procedure: OB  Staffing Anesthesiologist: Shelbia Scinto Performed: anesthesiologist   Preanesthetic Checklist Completed: patient identified, site marked, surgical consent, pre-op evaluation, timeout performed, IV checked, risks and benefits discussed and monitors and equipment checked  Epidural Patient position: sitting Prep: site prepped and draped and DuraPrep Patient monitoring: continuous pulse ox and blood pressure Approach: midline Location: L3-L4 Injection technique: LOR saline  Needle:  Needle type: Tuohy  Needle gauge: 17 G Needle length: 9 cm and 9 Needle insertion depth: 5 cm cm Catheter type: closed end flexible Catheter size: 19 Gauge Catheter at skin depth: 10 cm Test dose: negative  Assessment Events: blood not aspirated, injection not painful, no injection resistance, negative IV test and no paresthesia  Additional Notes Patient identified. Risks/Benefits/Options discussed with patient including but not limited to bleeding, infection, nerve damage, paralysis, failed block, incomplete pain control, headache, blood pressure changes, nausea, vomiting, reactions to medication both or allergic, itching and postpartum back pain. Confirmed with bedside nurse the patient's most recent platelet count. Confirmed with patient that they are not currently taking any anticoagulation, have any bleeding history or any family history of bleeding disorders. Patient expressed understanding and wished to proceed. All questions were answered. Sterile technique was used throughout the entire procedure. Please see nursing notes for vital signs. Test dose was given through epidural catheter and negative prior to continuing to dose epidural or start infusion. Warning signs of high block given to the patient including shortness of breath, tingling/numbness in hands, complete motor block, or any concerning symptoms with instructions to call for help. Patient was  given instructions on fall risk and not to get out of bed. All questions and concerns addressed with instructions to call with any issues or inadequate analgesia.        

## 2016-05-01 NOTE — Lactation Note (Signed)
This note was copied from a baby's chart. Lactation Consultation Note  Patient Name: Lisa Santana OACZY'S Date: 05/01/2016 Reason for consult: Initial assessment Baby at 3 hr of life. Mom was eating and did not seem like she was interested in listening to lactation. The baby was cuing, suggested mom bf the baby. She stated she is going to wait until after visiting hr are over. Discussed the importance of bf on demand. Offered help with latch and she stated "I got this". Discussed baby behavior, feeding frequency, baby belly size, voids, wt loss, breast changes, and nipple care. She stated she can manually express but " will probably just give him a bottle if he is not bf".  Given lactation handouts. Aware of OP services and support group.     Maternal Data Has patient been taught Hand Expression?: Yes Does the patient have breastfeeding experience prior to this delivery?: No  Feeding Feeding Type: Breast Fed Length of feed: 10 min  LATCH Score/Interventions Latch: Repeated attempts needed to sustain latch, nipple held in mouth throughout feeding, stimulation needed to elicit sucking reflex. Intervention(s): Adjust position  Audible Swallowing: A few with stimulation Intervention(s): Skin to skin  Type of Nipple: Everted at rest and after stimulation  Comfort (Breast/Nipple): Soft / non-tender     Hold (Positioning): Assistance needed to correctly position infant at breast and maintain latch. Intervention(s): Support Pillows;Skin to skin  LATCH Score: 7  Lactation Tools Discussed/Used WIC Program: Yes   Consult Status Consult Status: Follow-up Date: 05/02/16 Follow-up type: In-patient    Lisa Santana 05/01/2016, 8:42 PM

## 2016-05-01 NOTE — MAU Note (Signed)
Pt C/O uc's since 0530, having mucus discharge, denies bleeding or LOF.

## 2016-05-01 NOTE — Progress Notes (Signed)
Lisa Santana is a 25 y.o. G1P0 at [redacted]w[redacted]d by LMP admitted for active labor, rupture of membranes  Subjective: Patient comfortable now with epidural  Objective: BP 133/65   Pulse 73   Temp 98.6 F (37 C) (Oral)   Resp 18   Ht 5\' 4"  (1.626 m)   Wt 81.2 kg (179 lb)   LMP 07/27/2015   BMI 30.73 kg/m  No intake/output data recorded. No intake/output data recorded.  FHT:  FHR: 135 bpm, variability: moderate,  accelerations:  Present,  decelerations:  Present one spontaneous deceleration nadir 90's lasting 2 minutes with return to baseline UC:   regular, every 3 minutes SVE:   Dilation: 8.5 Effacement (%): 90 Station: 0 Exam by: Essie Hart MD  Labs: Lab Results  Component Value Date   WBC 9.5 05/01/2016   HGB 9.4 (L) 05/01/2016   HCT 30.1 (L) 05/01/2016   MCV 78.2 05/01/2016   PLT 188 05/01/2016    Assessment / Plan: Spontaneous labor, progressing normally Vulva and vagina checked no herpetic lesions noted  Labor: Progressing normally Preeclampsia:  no signs or symptoms of toxicity Fetal Wellbeing:  Category II Pain Control:  Epidural I/D:  n/a Anticipated MOD:  NSVD  Lisa Santana Lisa Santana 05/01/2016, 2:40 PM

## 2016-05-01 NOTE — MAU Note (Signed)
Pt called out for help, large puddle of clear fluid in floor, fern positive, EFM reapplied.

## 2016-05-01 NOTE — H&P (Signed)
Lisa Santana is a 25 y.o. female presenting for painful contractions. SROM in MAU  OB History    Gravida Para Term Preterm AB Living   1             SAB TAB Ectopic Multiple Live Births                 Past Medical History:  Diagnosis Date  . Acne vulgaris    Accutane in 2014.  Marland Kitchen Herpes genitalia    Past Surgical History:  Procedure Laterality Date  . JOINT REPLACEMENT    . KNEE ARTHROSCOPY WITH ANTERIOR CRUCIATE LIGAMENT (ACL) REPAIR    . WISDOM TOOTH EXTRACTION     Family History: family history includes Hypertension in her mother. Social History:  reports that she has never smoked. She has never used smokeless tobacco. She reports that she does not drink alcohol or use drugs.     Maternal Diabetes: No Genetic Screening: Normal Maternal Ultrasounds/Referrals: Normal Fetal Ultrasounds or other Referrals:  Other: anatomy scan normal Maternal Substance Abuse:  No Significant Maternal Medications:  Meds include: Other:  valtrex Significant Maternal Lab Results:  Lab values include: Group B Strep positive Other Comments:  None  ROS Maternal Medical History:  Reason for admission: Rupture of membranes and contractions.   Contractions: Onset was 1-2 hours ago.   Frequency: regular.   Duration is approximately 60 seconds.   Perceived severity is strong.    Fetal activity: Perceived fetal activity is normal.   Last perceived fetal movement was within the past 12 hours.    Prenatal complications: Infection.   Prenatal Complications - Diabetes: none.    Dilation: 4 Effacement (%): 90 Station: -2 Exam by:: Dorrene German RN Blood pressure 117/76, pulse 75, temperature 98.6 F (37 C), temperature source Oral, resp. rate 18, height  (1.626 m), weight 81.2 kg (179 lb), last menstrual period 07/27/2015. Maternal Exam:  Uterine Assessment: Contraction strength is moderate.  Contraction duration is 60 seconds. Contraction frequency is regular.   Abdomen: Patient reports  no abdominal tenderness. Fundal height is 40 cm.   Estimated fetal weight is 3000 grams.   Fetal presentation: vertex  Introitus: Normal vulva. Vulva is negative for lesion.  Normal vagina.  Vagina is negative for discharge.  Ferning test: positive.  Amniotic fluid character: clear.  Pelvis: adequate for delivery.   Cervix: Cervix evaluated by digital exam.   4/90/0  Fetal Exam Fetal Monitor Review: Baseline rate: 140.  Variability: moderate (6-25 bpm).   Pattern: accelerations present and no decelerations.    Fetal State Assessment: Category I - tracings are normal.     Physical Exam  Vitals reviewed. Constitutional: She is oriented to person, place, and time. She appears well-developed and well-nourished.  HENT:  Head: Normocephalic.  Eyes: Pupils are equal, round, and reactive to light.  Neck: Normal range of motion.  Respiratory: Effort normal.  GI: Soft.  Genitourinary: Vagina normal and uterus normal. Vulva exhibits no lesion. No vaginal discharge found.  Musculoskeletal: Normal range of motion.  Neurological: She is alert and oriented to person, place, and time.  Skin: Skin is warm.    Prenatal labs: ABO, Rh:   Antibody:   Rubella:   RPR: Non Reactive (12/13 0036)  HBsAg:    HIV: Non Reactive (12/13 0036)  GBS:   POS  Assessment/Plan: 25 yo G1P0 at 39 weeks 6 days SROM in early active labor Admit to L&D Ampicillin for GBS positive No herpes lesions on  exam Epidural on demand Continuous monitoring   Burton Gahan [name] 05/01/2016, 10:57 AM

## 2016-05-02 LAB — CBC
HEMATOCRIT: 23.8 % — AB (ref 36.0–46.0)
HEMOGLOBIN: 7.6 g/dL — AB (ref 12.0–15.0)
MCH: 24.8 pg — ABNORMAL LOW (ref 26.0–34.0)
MCHC: 31.9 g/dL (ref 30.0–36.0)
MCV: 77.8 fL — ABNORMAL LOW (ref 78.0–100.0)
Platelets: 158 10*3/uL (ref 150–400)
RBC: 3.06 MIL/uL — ABNORMAL LOW (ref 3.87–5.11)
RDW: 17.3 % — ABNORMAL HIGH (ref 11.5–15.5)
WBC: 13.9 10*3/uL — AB (ref 4.0–10.5)

## 2016-05-02 LAB — RPR: RPR: NONREACTIVE

## 2016-05-02 NOTE — Anesthesia Postprocedure Evaluation (Signed)
Anesthesia Post Note  Patient: Lisa Santana  Procedure(s) Performed: * No procedures listed *  Patient location during evaluation: Mother Baby Anesthesia Type: Regional Level of consciousness: awake and alert Pain management: pain level controlled Vital Signs Assessment: post-procedure vital signs reviewed and stable Respiratory status: spontaneous breathing and nonlabored ventilation Cardiovascular status: stable Postop Assessment: no headache, patient able to bend at knees, no backache, no signs of nausea or vomiting, epidural receding and adequate PO intake Anesthetic complications: no     Last Vitals:  Vitals:   05/02/16 0016 05/02/16 0820  BP: 115/62 (!) 101/51  Pulse: 99 68  Resp: 18 16  Temp: 36.9 C 36.7 C    Last Pain:  Vitals:   05/02/16 1119  TempSrc:   PainSc: 0-No pain   Pain Goal: Patients Stated Pain Goal: 4 (05/01/16 1210)               Donnalee Curry Hristova

## 2016-05-02 NOTE — Progress Notes (Signed)
Patient desires circ in office

## 2016-05-02 NOTE — Lactation Note (Signed)
This note was copied from a baby's chart. Lactation Consultation Note: Mother request to see Lactation to answer questions. Mother concerned she is not aware when infant is getting enough. Lots of teaching on infants ability to drain the breast and supply and demand . Infant sleeping in crib and mother states she will call for [name] for the next feeding to be assessed.   Patient Name: Lisa Santana [id number] Date: 05/02/2016     Maternal Data    Feeding Feeding Type: Breast Fed  LATCH Score/Interventions Latch: Repeated attempts needed to sustain latch, nipple held in mouth throughout feeding, stimulation needed to elicit sucking reflex. Intervention(s): Adjust position;Assist with latch;Breast compression  Audible Swallowing: Spontaneous and intermittent Intervention(s): Skin to skin;Hand expression Intervention(s): Skin to skin;Hand expression  Type of Nipple: Everted at rest and after stimulation  Comfort (Breast/Nipple): Soft / non-tender     Hold (Positioning): No assistance needed to correctly position infant at breast. Intervention(s): Breastfeeding basics reviewed;Support Pillows;Position options;Skin to skin  LATCH Score: 9  Lactation Tools Discussed/Used     Consult Status      Lisa Santana 05/02/2016, 11:31 AM

## 2016-05-02 NOTE — Progress Notes (Signed)
Patient is doing well.  She is ambulating, voiding, tolerating PO.  Pain control is good.  Lochia is appropriate  Vitals:   05/01/16 1920 05/01/16 2015 05/02/16 0016 05/02/16 0820  BP: 119/73 116/64 115/62 (!) 101/51  Pulse: (!) 103 99 99 68  Resp: 20 20 18 16   Temp: 97.9 F (36.6 C) 99.6 F (37.6 C) 98.4 F (36.9 C) 98 F (36.7 C)  TempSrc: Oral Oral Oral Oral  SpO2:   100%   Weight:      Height:        NAD Fundus firm Ext: no edema  Lab Results  Component Value Date   WBC 13.9 (H) 05/02/2016   HGB 7.6 (L) 05/02/2016   HCT 23.8 (L) 05/02/2016   MCV 77.8 (L) 05/02/2016   PLT 158 05/02/2016    --/--/O POS, O POS (07/30 1055)/RImmune  A/P 25 y.o. G1P1001 PPD#1 s/p VAVD. Routine care.   Expect d/c tomorrow. IDA w ABLA--hgb dec from 9.3 to 7.6--will d/c w po iron  Desires circumcision.  Baby w medicaid.  Pt is undecided at this time if she wants to proceed w circ in hospital or office.  Payment information provided.     [facility] GEFFEL [employer]

## 2016-05-03 ENCOUNTER — Ambulatory Visit: Payer: Self-pay

## 2016-05-03 MED ORDER — OXYCODONE-ACETAMINOPHEN 5-325 MG PO TABS: 2.0000 | ORAL_TABLET | ORAL | 0 refills | Status: AC | PRN

## 2016-05-03 MED ORDER — IBUPROFEN 600 MG PO TABS: 600.0000 mg | ORAL_TABLET | Freq: Four times a day (QID) | ORAL | 0 refills | Status: AC | PRN

## 2016-05-03 MED ORDER — DOCUSATE SODIUM 100 MG PO CAPS: 100.0000 mg | ORAL_CAPSULE | Freq: Two times a day (BID) | ORAL | 0 refills | Status: AC

## 2016-05-03 NOTE — Lactation Note (Signed)
This note was copied from a baby's chart. Lactation Consultation Note Mom requested assist again.  Baby  Is now showing more feeding cues and needs assist with getting a deep latch on right breast. Mom pumped for about 10 minutes and collected about .37ml given to baby with spoon by LC.  Baby extends tongue well to lick off spoon, but with gloved finger baby is not extending tongue past lower gumline to suck.  Mom denies pain with latch or feeling like baby is biting.  Encouraged mom to pump after feedings and discuss how that may work if baby is cluster feeding by trying to pump for a few minutes even if she cant for the whole 15-20 minutes, but feeding baby on demand as well.     Patient Name: Lisa Santana [id number] Date: 05/03/2016 Reason for consult: Follow-up assessment;Difficult latch;Infant weight loss;Hyperbilirubinemia   Maternal Data    Feeding Feeding Type: Breast Fed Length of feed:  (already fed for about 10 minutes feeding lasted about 25 minutes total)  LATCH Score/Interventions Latch: Repeated attempts needed to sustain latch, nipple held in mouth throughout feeding, stimulation needed to elicit sucking reflex. Intervention(s): Adjust position;Assist with latch;Breast massage;Breast compression  Audible Swallowing: A few with stimulation Intervention(s): Skin to skin;Hand expression  Type of Nipple: Everted at rest and after stimulation  Comfort (Breast/Nipple): Soft / non-tender  Interventions (Mild/moderate discomfort): Post-pump  Hold (Positioning): Assistance needed to correctly position infant at breast and maintain latch. Intervention(s): Breastfeeding basics reviewed;Support Pillows;Position options;Skin to skin  LATCH Score: 7  Lactation Tools Discussed/Used Pump Review: Setup, frequency, and cleaning;Milk Storage Initiated by:: JS Date initiated:: 05/03/16   Consult Status Consult Status: Follow-up Date: 05/04/16 Follow-up type:  In-patient    Lisa Santana 05/03/2016, 6:28 PM

## 2016-05-03 NOTE — Lactation Note (Signed)
This note was copied from a baby's chart. Lactation Consultation Note Mom and baby sleeping. Baby on DPT.  Patient Name: Lisa Santana [id number] Date: 05/03/2016     Maternal Data    Feeding Feeding Type: Breast Fed  LATCH Score/Interventions                      Lactation Tools Discussed/Used     Consult Status      Ludivina Guymon G 05/03/2016, 3:52 AM

## 2016-05-03 NOTE — Lactation Note (Signed)
This note was copied from a baby's chart. Lactation Consultation Note  Patient Name: Lisa Santana Date: 05/03/2016 Reason for consult: Follow-up assessment;Hyperbilirubinemia;Other (Comment) (baby is on double photo tx , OA incapibility )  Dr. Annie Main asked for [facility] consult at change of shift. Franz Dell RN , IBCLC aware for 3-11p when approximately the baby may feed.  Mom also aware to call on the nurse light for [facility] to watch the abby feed and have her DEBP set up.    Maternal Data    Feeding Feeding Type:  (per mom baby recently breast fed at 1400. presently sound asleep ) Length of feed: 25 min  LATCH Score/Interventions                Intervention(s): Breastfeeding basics reviewed     Lactation Tools Discussed/Used     Consult Status Consult Status: Follow-up Date: 05/03/16 Follow-up type: In-patient    Lisa Santana 05/03/2016, 4:39 PM

## 2016-05-03 NOTE — Lactation Note (Signed)
This note was copied from a baby's chart. Lactation Consultation Note Follow up visit at 48 hours of age.  Baby already feeding with RN at bedside who reports assisting to get a deep latch with swallows audible.  Baby is sleepy and needs stimulation to continue feeding.  MOm denies pain with latch and reports previous feedings were not this deep. LC asked mom if she has been working on hand expression and mom reports this is the first time her hands have been on her breast, Lc encouraged mom.  Mom prefers to have LC set up her own personal DEBP at bedside to use.  LC set up with instructions.  Baby maintained feeding for about 25 minutes with some audible swallows heard by mom.   Plan mom is to feed on demand or wake baby every 2 1/2-3 hours for feedings.  Mom to keep baby active during feeding and listen for swallows.  Mom to call for assist to get a deep latch if needed.  Mom to post pump both breasts for 15-20 minutes. MOm is using #24 flanges.  MOm to call for assist if she is able to collect EBM to supplement back to baby. Lc encouraged mom to keep at least one bili blanket on baby during feeding.  Mom to call for assist as needed.   Patient Name: Boy Ethelean Bianca [id number] Date: 05/03/2016 Reason for consult: Follow-up assessment;Difficult latch;Infant weight loss;Hyperbilirubinemia   Maternal Data    Feeding Feeding Type: Breast Fed Length of feed:  (already fed for about 10 minutes feeding lasted about 25 minutes total)  LATCH Score/Interventions Latch: Repeated attempts needed to sustain latch, nipple held in mouth throughout feeding, stimulation needed to elicit sucking reflex. Intervention(s): Adjust position;Assist with latch;Breast massage;Breast compression  Audible Swallowing: A few with stimulation Intervention(s): Skin to skin;Hand expression  Type of Nipple: Everted at rest and after stimulation  Comfort (Breast/Nipple): Soft / non-tender  Interventions (Mild/moderate  discomfort): Post-pump  Hold (Positioning): Assistance needed to correctly position infant at breast and maintain latch. Intervention(s): Breastfeeding basics reviewed;Support Pillows;Position options;Skin to skin  LATCH Score: 7  Lactation Tools Discussed/Used Pump Review: Setup, frequency, and cleaning;Milk Storage Initiated by:: JS Date initiated:: 05/03/16   Consult Status Consult Status: Follow-up Date: 05/04/16 Follow-up type: In-patient    Jannifer Rodney 05/03/2016, 5:54 PM

## 2016-05-03 NOTE — Lactation Note (Signed)
This note was copied from a baby's chart. Lactation Consultation Note  Patient Name: Lisa Santana IEPPI'R Date: 05/03/2016 Reason for consult: Follow-up assessment;Hyperbilirubinemia;Other (Comment) (baby is on double photo tx , OA incapibility )  Baby is 46 hours and changed to a Baby patient today due to elevated bilirubin.  Per mom MBU RN serum bili will be drawn later today and in am.  Per mom baby recently breast fed at 1400 and presently sleeping on double photo .  LC reviewed basics , and potential baby being sluggish due to jaundice. Importance of not going over 3 hours without offering the breast.  Also extra post pumping to enhance the milk coming in .  LC recommended to mom to call with feeding cues for next feeding so LC could assess latch and assist her to set up the DEBP for  Post pumping. ( mom has her own DEBP Medela in the room )     Maternal Data    Feeding Feeding Type:  (per mom baby recently breast fed at 1400. presently sound asleep ) Length of feed: 25 min  LATCH Score/Interventions                Intervention(s): Breastfeeding basics reviewed     Lactation Tools Discussed/Used     Consult Status Consult Status: Follow-up Date: 05/03/16 Follow-up type: In-patient    Kathrin Greathouse 05/03/2016, 3:56 PM

## 2016-05-03 NOTE — Discharge Summary (Signed)
Obstetric Discharge Summary Reason for Admission: rupture of membranes Prenatal Procedures: ultrasound Intrapartum Procedures: vacuum Postpartum Procedures: none Complications-Operative and Postpartum: 2nd degree perineal laceration Hemoglobin  Date Value Ref Range Status  05/02/2016 7.6 (L) 12.0 - 15.0 g/dL Final   HCT  Date Value Ref Range Status  05/02/2016 23.8 (L) 36.0 - 46.0 % Final    Physical Exam:  General: alert, cooperative and appears stated age 25: appropriate Uterine Fundus: firm Incision: healing well DVT Evaluation: No evidence of DVT seen on physical exam.  Discharge Diagnoses: Term Pregnancy-delivered  Discharge Information: Date: 05/03/2016 Activity: pelvic rest Diet: routine Medications: Ibuprofen, Colace and Percocet Condition: improved Instructions: refer to practice specific booklet Discharge to: home Follow-up Information    PINN, Sanjuana Mae, MD Follow up in 4 week(s).   Specialty:  Obstetrics and Gynecology Why:  For a postpartum evaluation Contact information: [address] [place] [address] [phone]           Newborn Data: Live born female  Birth Weight: 7 lb 4.6 oz (3305 g) APGAR: 8, 9  Home with mother.  Trayquan Kolakowski H. 05/03/2016, 9:46 AM

## 2016-05-04 ENCOUNTER — Ambulatory Visit: Payer: Self-pay

## 2016-05-04 NOTE — Lactation Note (Signed)
This note was copied from a baby's chart. Lactation Consultation Note  Patient Name: Lisa Santana ASNKN'L Date: 05/04/2016 Reason for consult: Follow-up assessment;Infant weight loss;Hyperbilirubinemia (baby patient - 9% weight loss, Bili - 9.7 )  Baby is 59 1/2 hours old and still on Double Photo tx .  Baby has been getting more consistent with latching and per mom during the night due to cluster feeding - supplemented 12 ml and 13 ml with a bottle.  @ this consult LC recommended due to 9% weight loss to continue supplementing , post pumping ( which mom started last evening with her own DEBP ( Medela )  LC assisted mom to latch after she massage breast , hand expressed ( several drops noted) areola more compressible , but still need work and shells are indicated.  Baby latched well with depth , swallows noted, LC added 24F SNS in the side of the baby's mouth while the baby was latched. Baby tolerated well and took 8 ml of EBM 1st ,  2nd syringe of formula 12 ml. Baby fed 20 mins and released , nipple well rounded when abby released.  LC reviewed the [facility] plan with mom - breastfeed 1st breast 20 mins with SNS ( which mom will need assist )20 ml ( EBM and formula ) , settled baby , post pump 15 -20 mins , save milk For next feed. LC also instructed mom on the use shells to enhance the areola becoming more compressible for a deeper latch.  Mom aware to call for next feeding. RN and Pedis MD aware of LC plan .    Maternal Data    Feeding Feeding Type: Breast Milk with Formula added Length of feed: 20 min (SNS at the breast )  LATCH Score/Interventions Latch: Grasps breast easily, tongue down, lips flanged, rhythmical sucking.  Audible Swallowing: Spontaneous and intermittent Intervention(s): Skin to skin;Hand expression  Type of Nipple: Everted at rest and after stimulation (semi compressible areolas )  Comfort (Breast/Nipple): Filling, red/small blisters or bruises, mild/mod  discomfort     Hold (Positioning): Assistance needed to correctly position infant at breast and maintain latch.  LATCH Score: 8  Lactation Tools Discussed/Used Tools: Shells;Pump;24F feeding tube / Syringe Shell Type: Inverted Breast pump type: Double-Electric Breast Pump (mom has her own DEBP )   Consult Status Consult Status: Follow-up Date: 05/04/16 Follow-up type: In-patient    Kathrin Greathouse 05/04/2016, 10:59 AM

## 2016-05-04 NOTE — Lactation Note (Signed)
This note was copied from a baby's chart. Lactation Consultation Note  Patient Name: Lisa Santana [id number] Date: 05/04/2016 Reason for consult: Follow-up assessment;Infant weight loss;Other (Comment);Hyperbilirubinemia (9% weight loss )  Baby is 57 1/2 hours old , still on double Photo tx.  Baby asleep when LC entered the room, and per mom was able to take a nap herself.  It has been 3 hours , LC checked diaper and it was dry.  LC placed baby skin to skin on the right breast / football position/ assisted mom to latch and added the 650F feeding tube  After the baby latched, small leakage at 1st. Baby tolerated the SNS well and took 5.5 of EBM and 12 ml of formula.  No spitting. Multiply swallows noted before the SNS applied, and increased with SNS. Per mom comfortable with latch.  Baby  fed 20 mins. Nipple well rounded when baby released. Mom plans post pump both breast for 15 -20 mins and save milk  For next feeding.  Mom seemed to be more comfortable latching at this consult. LC still needed to assist with the SNS. She shortly will be having  A friend come and she will be with her until tomorrow when her Cathren Harsh will be there.  Per mom her friend will be more than willing to assist and will needed to be shown how to assist with the 650F SNS with the latch.     Maternal Data Has patient been taught Hand Expression?: Yes (per mom breast are feeling fuller and LC noted it also )  Feeding Feeding Type: Breast Milk with Formula added Length of feed: 25 min  LATCH Score/Interventions Latch: Grasps breast easily, tongue down, lips flanged, rhythmical sucking. Intervention(s): Adjust position;Assist with latch;Breast massage;Breast compression  Audible Swallowing: Spontaneous and intermittent Intervention(s): Skin to skin;Hand expression  Type of Nipple: Everted at rest and after stimulation  Comfort (Breast/Nipple): Filling, red/small blisters or bruises, mild/mod discomfort  Problem  noted: Filling  Hold (Positioning): Assistance needed to correctly position infant at breast and maintain latch. Intervention(s): Breastfeeding basics reviewed;Support Pillows;Position options;Skin to skin  LATCH Score: 8  Lactation Tools Discussed/Used Tools: 650F feeding tube / Syringe Shell Type: Inverted Breast pump type: Double-Electric Breast Pump (mom has her own DEBP )   Consult Status Consult Status: Follow-up Date: 05/04/16 Follow-up type: In-patient    Lisa Santana 05/04/2016, 2:10 PM

## 2017-02-04 IMAGING — US US OB COMP LESS 14 WK
1 series · 14 of 28 positions shown · non-contrast
Comparison: None.

CLINICAL DATA: Abdominal pain during pregnancy. Estimated
gestational age by LMP is 7 weeks 1 day. Quantitative beta HCG is
[DATE].

EXAM:
OBSTETRIC <14 WK US AND TRANSVAGINAL OB US
TECHNIQUE: Both transabdominal and transvaginal ultrasound examinations were
performed for complete evaluation of the gestation as well as the
maternal uterus, adnexal regions, and pelvic cul-de-sac.
Transvaginal technique was performed to assess early pregnancy.

[Series 1: us ob comp less 14 wk · 0.22mm/px · 14 of 54 slices shown]
[im 2/54]
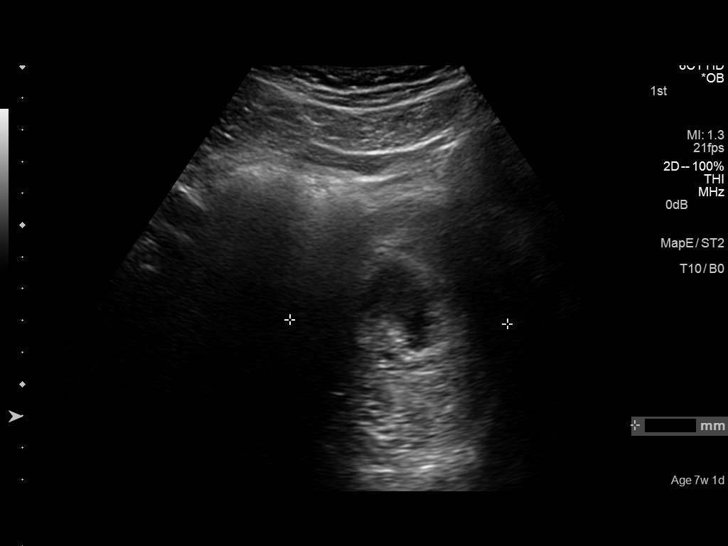
[im 6/54]
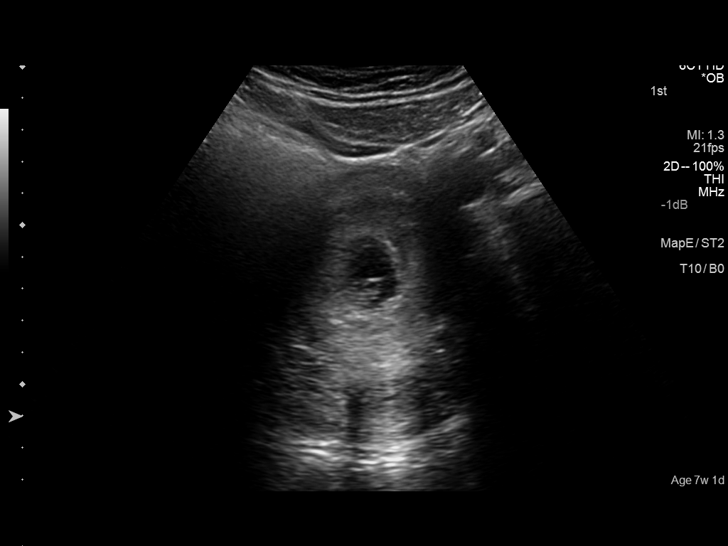
[im 10/54]
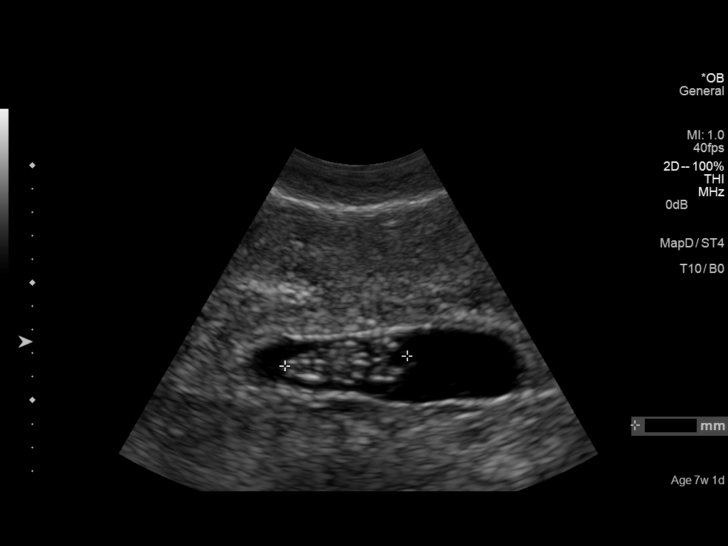
[im 14/54]
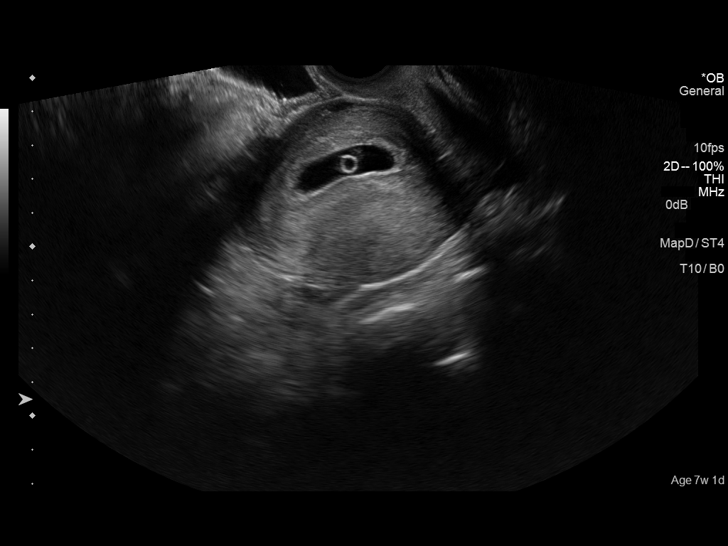
[im 18/54]
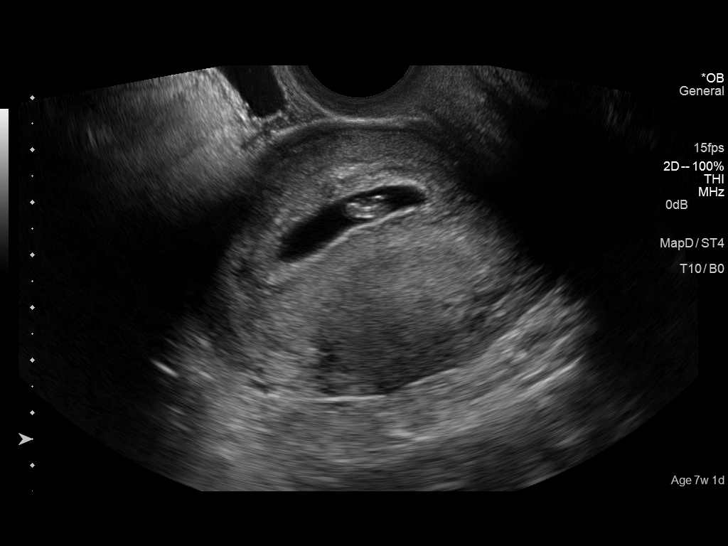
[im 22/54]
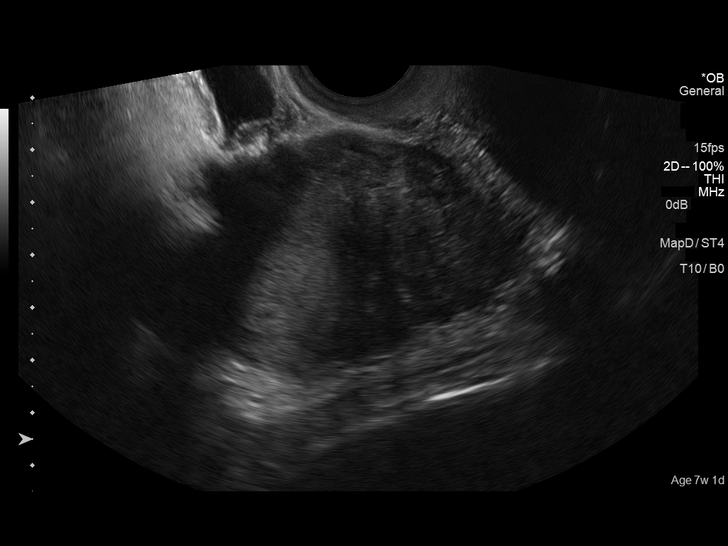
[im 26/54]
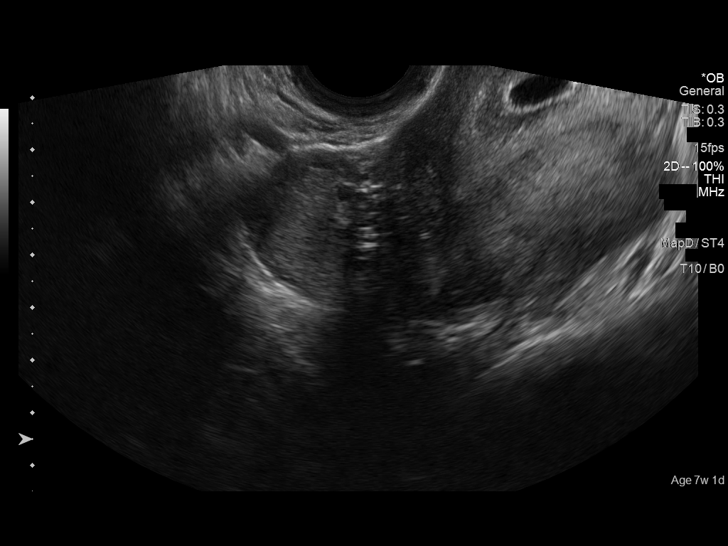
[im 30/54]
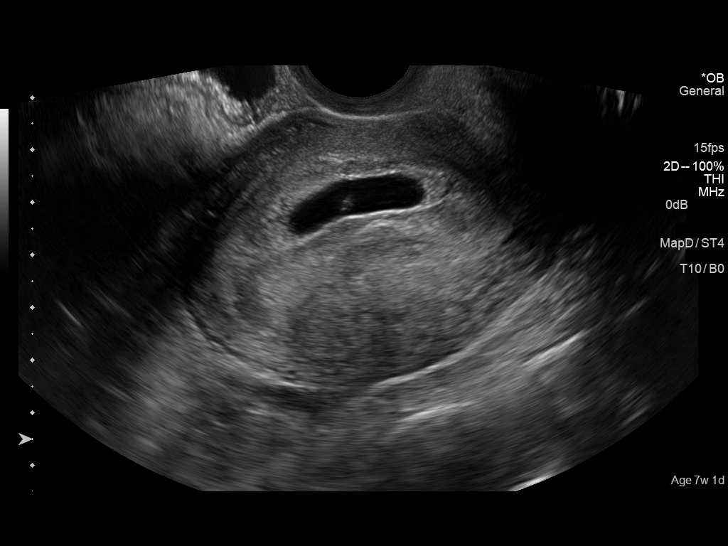
[im 34/54]
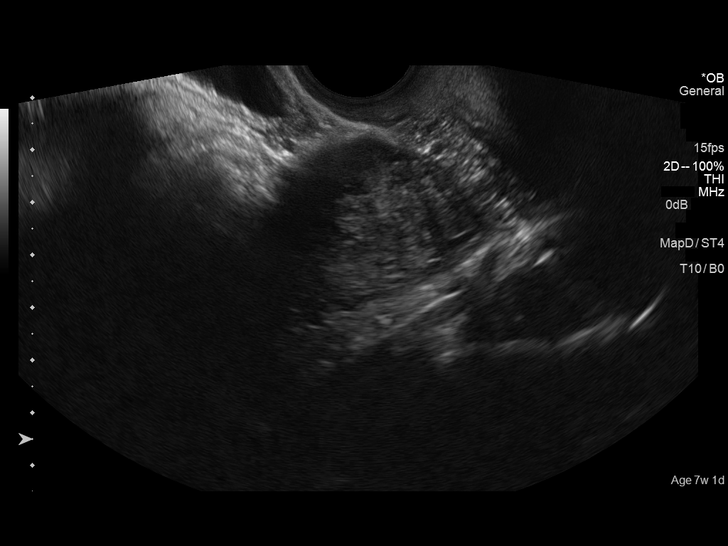
[im 38/54]
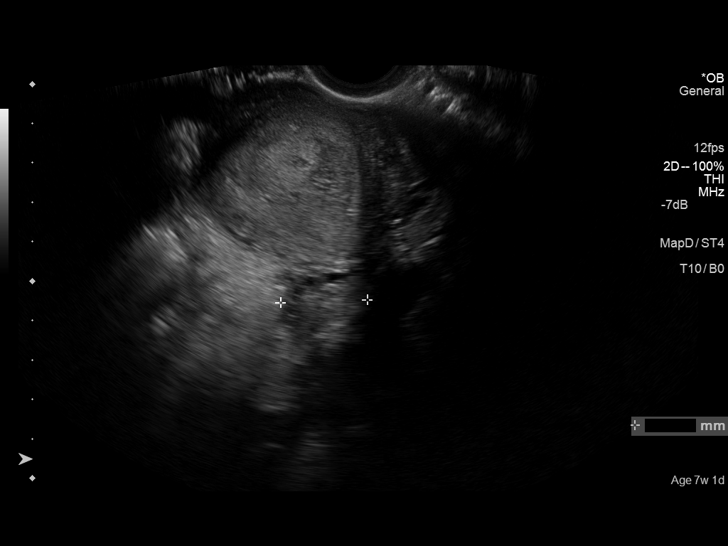
[im 42/54]
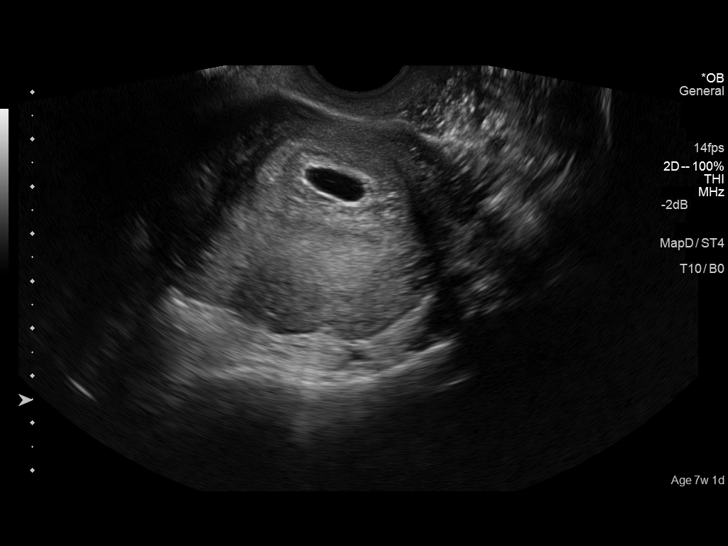
[im 46/54]
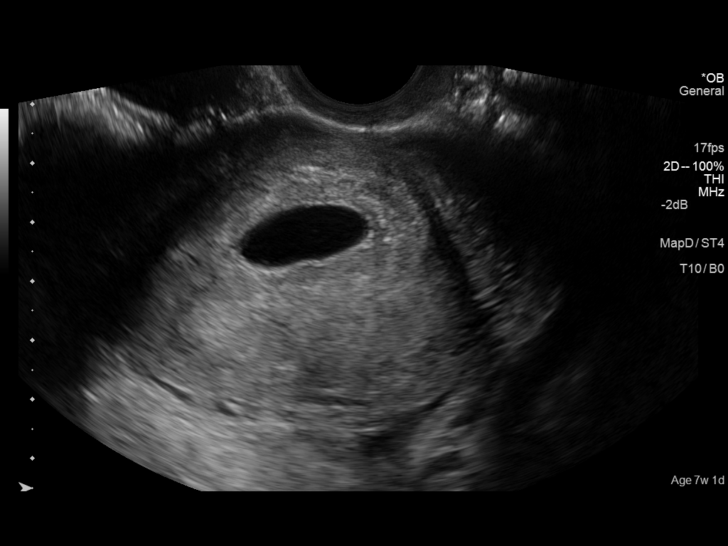
[im 50/54]
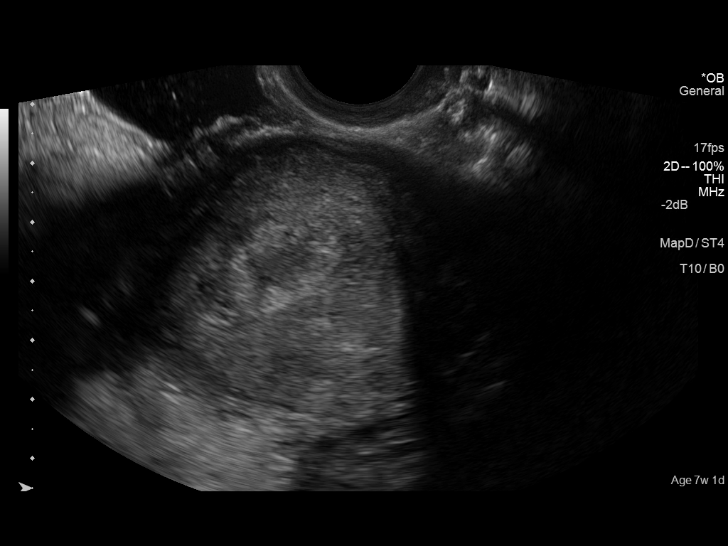
[im 54/54]
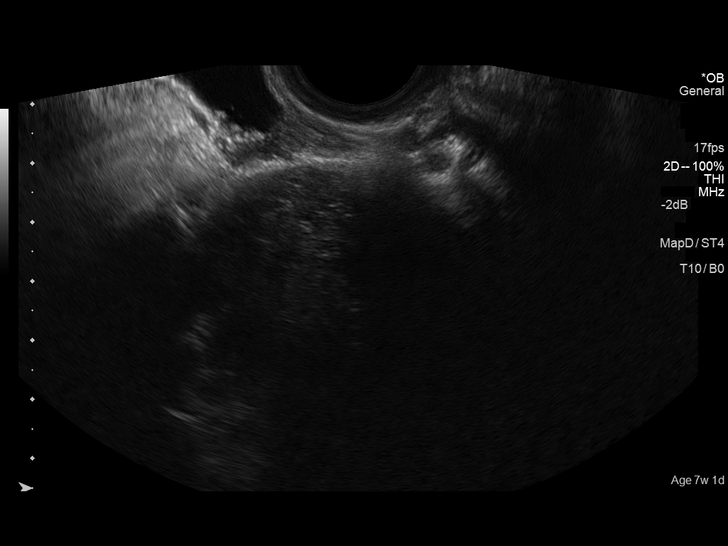

[14 of 28 positions shown; findings below may reference images not displayed]

FINDINGS: Intrauterine gestational sac: A single intrauterine pregnancy is
identified.

Yolk sac:  Yolk sac is visualized.

Embryo:  Fetal pole is visualized.

Cardiac Activity: Fetal cardiac activity is observed.

Heart Rate: 139  bpm

CRL:  10.1  mm   7 w   1 d                  US EDC: 05/02/2016

Maternal uterus/adnexae: Uterus is anteverted. No myometrial mass
lesions identified. No subchorionic hemorrhage. Cervix appears
intact. Both ovaries are visualized. No abnormal adnexal mass
lesions are identified. No significant free fluid in the pelvis.
IMPRESSION: Single intrauterine pregnancy. Estimated gestational age by
crown-rump length is 7 weeks 1 day. No acute complication suggested.
